# Patient Record
Sex: Female | Born: 1950 | Race: Black or African American | Hispanic: No | Smoking: Never smoker
Health system: Southern US, Community
[De-identification: ages and names within clinical notes are randomized; demographics above are authoritative.]

## PROBLEM LIST (undated history)

## (undated) DIAGNOSIS — F419 Anxiety disorder, unspecified: Secondary | ICD-10-CM

## (undated) DIAGNOSIS — M1990 Unspecified osteoarthritis, unspecified site: Secondary | ICD-10-CM

## (undated) DIAGNOSIS — G4730 Sleep apnea, unspecified: Secondary | ICD-10-CM

## (undated) DIAGNOSIS — I10 Essential (primary) hypertension: Secondary | ICD-10-CM

## (undated) DIAGNOSIS — Z5189 Encounter for other specified aftercare: Secondary | ICD-10-CM

## (undated) DIAGNOSIS — D649 Anemia, unspecified: Secondary | ICD-10-CM

## (undated) DIAGNOSIS — M797 Fibromyalgia: Secondary | ICD-10-CM

## (undated) DIAGNOSIS — F329 Major depressive disorder, single episode, unspecified: Secondary | ICD-10-CM

## (undated) DIAGNOSIS — K219 Gastro-esophageal reflux disease without esophagitis: Secondary | ICD-10-CM

## (undated) DIAGNOSIS — IMO0001 Reserved for inherently not codable concepts without codable children: Secondary | ICD-10-CM

## (undated) DIAGNOSIS — K5792 Diverticulitis of intestine, part unspecified, without perforation or abscess without bleeding: Secondary | ICD-10-CM

## (undated) DIAGNOSIS — F32A Depression, unspecified: Secondary | ICD-10-CM

## (undated) DIAGNOSIS — J189 Pneumonia, unspecified organism: Secondary | ICD-10-CM

## (undated) HISTORY — PX: ABDOMINAL HYSTERECTOMY: SHX81

## (undated) HISTORY — PX: KNEE ARTHROSCOPY: SUR90

## (undated) HISTORY — PX: CHOLECYSTECTOMY: SHX55

## (undated) HISTORY — PX: COLONOSCOPY: SHX174

## (undated) HISTORY — PX: TONSILLECTOMY: SUR1361

## (undated) HISTORY — DX: Sleep apnea, unspecified: G47.30

---

## 1998-06-04 ENCOUNTER — Other Ambulatory Visit: Admission: RE | Admit: 1998-06-04 | Discharge: 1998-06-04 | Payer: Self-pay | Admitting: Obstetrics and Gynecology

## 1999-10-14 ENCOUNTER — Other Ambulatory Visit: Admission: RE | Admit: 1999-10-14 | Discharge: 1999-10-14 | Payer: Self-pay | Admitting: Obstetrics and Gynecology

## 2000-02-15 HISTORY — PX: KNEE ARTHROPLASTY: SHX992

## 2000-12-08 ENCOUNTER — Other Ambulatory Visit: Admission: RE | Admit: 2000-12-08 | Discharge: 2000-12-08 | Payer: Self-pay | Admitting: Obstetrics and Gynecology

## 2001-06-19 ENCOUNTER — Encounter: Payer: Self-pay | Admitting: Orthopedic Surgery

## 2001-06-25 ENCOUNTER — Inpatient Hospital Stay (HOSPITAL_COMMUNITY): Admission: RE | Admit: 2001-06-25 | Discharge: 2001-07-02 | Payer: Self-pay | Admitting: Orthopedic Surgery

## 2001-06-25 ENCOUNTER — Encounter: Payer: Self-pay | Admitting: Orthopedic Surgery

## 2001-06-29 ENCOUNTER — Encounter: Payer: Self-pay | Admitting: Orthopedic Surgery

## 2001-07-02 ENCOUNTER — Inpatient Hospital Stay (HOSPITAL_COMMUNITY)
Admission: AD | Admit: 2001-07-02 | Discharge: 2001-07-07 | Payer: Self-pay | Admitting: Physical Medicine & Rehabilitation

## 2001-12-18 ENCOUNTER — Other Ambulatory Visit: Admission: RE | Admit: 2001-12-18 | Discharge: 2001-12-18 | Payer: Self-pay | Admitting: Obstetrics and Gynecology

## 2001-12-19 ENCOUNTER — Ambulatory Visit (HOSPITAL_BASED_OUTPATIENT_CLINIC_OR_DEPARTMENT_OTHER): Admission: RE | Admit: 2001-12-19 | Discharge: 2001-12-19 | Payer: Self-pay | Admitting: Orthopedic Surgery

## 2004-01-22 ENCOUNTER — Emergency Department (HOSPITAL_COMMUNITY): Admission: EM | Admit: 2004-01-22 | Discharge: 2004-01-22 | Payer: Self-pay | Admitting: Emergency Medicine

## 2005-05-30 ENCOUNTER — Other Ambulatory Visit: Admission: RE | Admit: 2005-05-30 | Discharge: 2005-05-30 | Payer: Self-pay | Admitting: Obstetrics and Gynecology

## 2006-07-07 ENCOUNTER — Other Ambulatory Visit: Admission: RE | Admit: 2006-07-07 | Discharge: 2006-07-07 | Payer: Self-pay | Admitting: Obstetrics and Gynecology

## 2007-11-07 ENCOUNTER — Encounter: Admission: RE | Admit: 2007-11-07 | Discharge: 2007-11-07 | Payer: Self-pay | Admitting: Gastroenterology

## 2009-02-05 ENCOUNTER — Emergency Department (HOSPITAL_COMMUNITY): Admission: EM | Admit: 2009-02-05 | Discharge: 2009-02-05 | Payer: Self-pay | Admitting: Emergency Medicine

## 2010-04-23 ENCOUNTER — Observation Stay (HOSPITAL_COMMUNITY)
Admission: EM | Admit: 2010-04-23 | Discharge: 2010-04-24 | Disposition: A | Discharge status: Home / Self Care | Payer: 59 | Attending: Internal Medicine | Admitting: Internal Medicine

## 2010-04-23 ENCOUNTER — Emergency Department (HOSPITAL_COMMUNITY): Payer: 59

## 2010-04-23 DIAGNOSIS — K859 Acute pancreatitis without necrosis or infection, unspecified: Principal | ICD-10-CM | POA: Insufficient documentation

## 2010-04-23 DIAGNOSIS — Z79899 Other long term (current) drug therapy: Secondary | ICD-10-CM | POA: Insufficient documentation

## 2010-04-23 DIAGNOSIS — R7402 Elevation of levels of lactic acid dehydrogenase (LDH): Secondary | ICD-10-CM | POA: Insufficient documentation

## 2010-04-23 DIAGNOSIS — I10 Essential (primary) hypertension: Secondary | ICD-10-CM | POA: Insufficient documentation

## 2010-04-23 DIAGNOSIS — IMO0001 Reserved for inherently not codable concepts without codable children: Secondary | ICD-10-CM | POA: Insufficient documentation

## 2010-04-23 DIAGNOSIS — E876 Hypokalemia: Secondary | ICD-10-CM | POA: Insufficient documentation

## 2010-04-23 DIAGNOSIS — M1990 Unspecified osteoarthritis, unspecified site: Secondary | ICD-10-CM | POA: Insufficient documentation

## 2010-04-23 DIAGNOSIS — J45909 Unspecified asthma, uncomplicated: Secondary | ICD-10-CM | POA: Insufficient documentation

## 2010-04-23 DIAGNOSIS — R7401 Elevation of levels of liver transaminase levels: Secondary | ICD-10-CM | POA: Insufficient documentation

## 2010-04-23 DIAGNOSIS — D649 Anemia, unspecified: Secondary | ICD-10-CM | POA: Insufficient documentation

## 2010-04-23 DIAGNOSIS — K8020 Calculus of gallbladder without cholecystitis without obstruction: Secondary | ICD-10-CM | POA: Insufficient documentation

## 2010-04-23 DIAGNOSIS — K5730 Diverticulosis of large intestine without perforation or abscess without bleeding: Secondary | ICD-10-CM | POA: Insufficient documentation

## 2010-04-23 LAB — CBC
HCT: 32.5 % — ABNORMAL LOW (ref 36.0–46.0)
Hemoglobin: 10.9 g/dL — ABNORMAL LOW (ref 12.0–15.0)
MCH: 28.7 pg (ref 26.0–34.0)
MCV: 85.5 fL (ref 78.0–100.0)
RBC: 3.8 MIL/uL — ABNORMAL LOW (ref 3.87–5.11)

## 2010-04-23 LAB — URINALYSIS, ROUTINE W REFLEX MICROSCOPIC
Glucose, UA: NEGATIVE mg/dL
Hgb urine dipstick: NEGATIVE
Protein, ur: NEGATIVE mg/dL
Specific Gravity, Urine: 1.02 (ref 1.005–1.030)
pH: 5.5 (ref 5.0–8.0)

## 2010-04-23 LAB — COMPREHENSIVE METABOLIC PANEL
ALT: 76 U/L — ABNORMAL HIGH (ref 0–35)
CO2: 30 mEq/L (ref 19–32)
Calcium: 10.1 mg/dL (ref 8.4–10.5)
Chloride: 96 mEq/L (ref 96–112)
Creatinine, Ser: 0.76 mg/dL (ref 0.4–1.2)
GFR calc non Af Amer: >60 mL/min (ref >60)
Glucose, Bld: 125 mg/dL — ABNORMAL HIGH (ref 70–99)
Total Bilirubin: 1.7 mg/dL — ABNORMAL HIGH (ref 0.3–1.2)

## 2010-04-23 LAB — DIFFERENTIAL
Lymphocytes Relative: 15 % (ref 12–46)
Lymphs Abs: 1.7 10*3/uL (ref 0.7–4.0)
Monocytes Relative: 5 % (ref 3–12)
Neutro Abs: 9.3 10*3/uL — ABNORMAL HIGH (ref 1.7–7.7)
Neutrophils Relative %: 80 % — ABNORMAL HIGH (ref 43–77)

## 2010-04-24 LAB — CBC
MCV: 85.4 fL (ref 78.0–100.0)
Platelets: 296 10*3/uL (ref 150–400)
RBC: 3.35 MIL/uL — ABNORMAL LOW (ref 3.87–5.11)
RDW: 13.8 % (ref 11.5–15.5)
WBC: 6.5 10*3/uL (ref 4.0–10.5)

## 2010-04-24 LAB — COMPREHENSIVE METABOLIC PANEL
ALT: 97 U/L — ABNORMAL HIGH (ref 0–35)
AST: 114 U/L — ABNORMAL HIGH (ref 0–37)
Albumin: 3.4 g/dL — ABNORMAL LOW (ref 3.5–5.2)
Alkaline Phosphatase: 90 U/L (ref 39–117)
CO2: 32 mEq/L (ref 19–32)
Chloride: 100 mEq/L (ref 96–112)
Creatinine, Ser: 0.82 mg/dL (ref 0.4–1.2)
GFR calc non Af Amer: >60 mL/min (ref >60)
Potassium: 3.5 mEq/L (ref 3.5–5.1)
Total Bilirubin: 0.7 mg/dL (ref 0.3–1.2)

## 2010-04-24 LAB — PHOSPHORUS: Phosphorus: 3.2 mg/dL (ref 2.3–4.6)

## 2010-04-24 LAB — LIPID PANEL: Total CHOL/HDL Ratio: 2.3 RATIO

## 2010-04-24 LAB — TSH: TSH: 0.693 u[IU]/mL (ref 0.350–4.500)

## 2010-04-24 LAB — MAGNESIUM: Magnesium: 2.2 mg/dL (ref 1.5–2.5)

## 2010-04-27 ENCOUNTER — Other Ambulatory Visit: Payer: Self-pay | Admitting: Internal Medicine

## 2010-04-27 DIAGNOSIS — K859 Acute pancreatitis without necrosis or infection, unspecified: Secondary | ICD-10-CM

## 2010-05-10 ENCOUNTER — Ambulatory Visit (HOSPITAL_COMMUNITY)
Admission: RE | Admit: 2010-05-10 | Discharge: 2010-05-10 | Disposition: A | Discharge status: Home / Self Care | Payer: 59 | Source: Ambulatory Visit | Attending: General Surgery | Admitting: General Surgery

## 2010-05-10 ENCOUNTER — Other Ambulatory Visit: Payer: Self-pay | Admitting: General Surgery

## 2010-05-10 ENCOUNTER — Other Ambulatory Visit (HOSPITAL_COMMUNITY): Payer: Self-pay | Admitting: General Surgery

## 2010-05-10 ENCOUNTER — Encounter (HOSPITAL_COMMUNITY): Payer: 59

## 2010-05-10 DIAGNOSIS — Z01818 Encounter for other preprocedural examination: Secondary | ICD-10-CM

## 2010-05-10 DIAGNOSIS — K8020 Calculus of gallbladder without cholecystitis without obstruction: Secondary | ICD-10-CM | POA: Insufficient documentation

## 2010-05-10 DIAGNOSIS — Z01810 Encounter for preprocedural cardiovascular examination: Secondary | ICD-10-CM | POA: Insufficient documentation

## 2010-05-10 DIAGNOSIS — Z01812 Encounter for preprocedural laboratory examination: Secondary | ICD-10-CM | POA: Insufficient documentation

## 2010-05-10 LAB — BASIC METABOLIC PANEL
BUN: 6 mg/dL (ref 6–23)
Calcium: 9.5 mg/dL (ref 8.4–10.5)
Creatinine, Ser: 0.66 mg/dL (ref 0.4–1.2)
GFR calc non Af Amer: >60 mL/min (ref >60)
Glucose, Bld: 79 mg/dL (ref 70–99)
Potassium: 3.5 mEq/L (ref 3.5–5.1)

## 2010-05-10 LAB — CBC
HCT: 30.3 % — ABNORMAL LOW (ref 36.0–46.0)
Hemoglobin: 9.7 g/dL — ABNORMAL LOW (ref 12.0–15.0)
MCH: 28 pg (ref 26.0–34.0)
MCHC: 32 g/dL (ref 30.0–36.0)
MCV: 87.3 fL (ref 78.0–100.0)
RDW: 13.7 % (ref 11.5–15.5)

## 2010-05-11 ENCOUNTER — Other Ambulatory Visit: Payer: Self-pay | Admitting: General Surgery

## 2010-05-11 ENCOUNTER — Ambulatory Visit (HOSPITAL_COMMUNITY): Payer: 59

## 2010-05-11 ENCOUNTER — Ambulatory Visit (HOSPITAL_COMMUNITY)
Admission: RE | Admit: 2010-05-11 | Discharge: 2010-05-11 | Disposition: A | Discharge status: Home / Self Care | Payer: 59 | Source: Ambulatory Visit | Attending: General Surgery | Admitting: General Surgery

## 2010-05-11 DIAGNOSIS — Z01812 Encounter for preprocedural laboratory examination: Secondary | ICD-10-CM | POA: Insufficient documentation

## 2010-05-11 DIAGNOSIS — Z79899 Other long term (current) drug therapy: Secondary | ICD-10-CM | POA: Insufficient documentation

## 2010-05-11 DIAGNOSIS — I10 Essential (primary) hypertension: Secondary | ICD-10-CM | POA: Insufficient documentation

## 2010-05-11 DIAGNOSIS — K8010 Calculus of gallbladder with chronic cholecystitis without obstruction: Secondary | ICD-10-CM | POA: Insufficient documentation

## 2010-05-11 DIAGNOSIS — K861 Other chronic pancreatitis: Secondary | ICD-10-CM | POA: Insufficient documentation

## 2010-05-11 LAB — HEPATIC FUNCTION PANEL
ALT: 42 U/L — ABNORMAL HIGH (ref 0–35)
Alkaline Phosphatase: 56 U/L (ref 39–117)
Indirect Bilirubin: 0.4 mg/dL (ref 0.3–0.9)
Total Bilirubin: 0.6 mg/dL (ref 0.3–1.2)
Total Protein: 6.4 g/dL (ref 6.0–8.3)

## 2010-05-17 LAB — URINALYSIS, ROUTINE W REFLEX MICROSCOPIC
Bilirubin Urine: NEGATIVE
Ketones, ur: NEGATIVE mg/dL
Nitrite: NEGATIVE
Protein, ur: NEGATIVE mg/dL
pH: 5.5 (ref 5.0–8.0)

## 2010-05-17 NOTE — Op Note (Signed)
NAMEROANNE, HAYE            ACCOUNT NO.:  [id number]  MEDICAL RECORD NO.:  [id number]           PATIENT TYPE:  O  LOCATION:  DAYL                         FACILITY:  [facility]  PHYSICIAN:  Sharlet Salina T. Maya Arcand, M.D.DATE OF BIRTH:  1950/10/09  DATE OF PROCEDURE:  05/11/2010 DATE OF DISCHARGE:                              OPERATIVE REPORT   PREOPERATIVE DIAGNOSES: 1. Cholelithiasis. 2. Gallstone pancreatitis.  POSTOPERATIVE DIAGNOSES: 1. Cholelithiasis. 2. Gallstone pancreatitis.  SURGICAL PROCEDURE:  Laparoscopic cholecystectomy with intraoperative cholangiogram.  SURGEON:  Sharlet Salina T. Nefertari Rebman, MD  ANESTHESIA:  General.  BRIEF HISTORY:  Ms. Devaux is a 60 year old female who recently presented with an acute episode of severe upper abdominal pain and was found to have evidence of gallstone pancreatitis with a markedly elevated lipase that returned to normal and mildly elevated LFTs that have returned to normal.  She has continued to have some mild upper abdominal symptoms compatible with chronic cholecystitis.  Gallbladder ultrasound has shown multiple small gallstones.  With an apparent episode of gallstone pancreatitis, I have recommended proceeding with laparoscopic cholecystectomy with cholangiogram.  The nature of the procedure, its indications, risks of bleeding, infection, bile leak, bile duct injury, and anesthetic complications were discussed and understood.  The patient was now brought to the operating room for this procedure.  DESCRIPTION OF OPERATION:  The patient was brought to the operating room, placed in supine position on the operating table and general endotracheal anesthesia was induced.  She had received preoperative IV antibiotics.  PAS were placed.  The abdomen was widely sterilely prepped and draped and correct patient and procedure were verified.  Local anesthesia was used to infiltrate the trocar sites.  A 1.5 cm incision was made at the  umbilicus.  Dissection carried down the midline.  Fascia was then incised 1 cm transversely and the peritoneum entered under direct vision.  Through a mattress suture of 0 Vicryl, the Hasson trocar was placed and pneumoperitoneum was established.  Under direct vision, a 11 mm trocar was placed subxiphoid and two 5-mm trocars on the right subcostal margin.  The gallbladder was mildly distended but not acutely inflamed.  There were some omental adhesions and filmy adhesions of the duodenum up to the fundus and infundibulum that were carefully taken down and then the infundibulum retracted inferolaterally.  Peritoneum anterior and posterior to closed triangle was incised and fibrofatty tissue was stripped off the neck of the gallbladder toward the porta hepatis.  The distal gallbladder in closed triangle was thoroughly dissected.  The cystic artery was clearly identified coursing up the gallbladder wall, was divided between 2 proximal and 1 distal clip.  The cystic duct gallbladder junction was dissected.  The cyst duct dissected out over about a centimeter.  The cystic duct gallbladder junction was dissected 360 degrees.  When a good critical view was obtained, the cystic duct was clipped at the gallbladder junction and operative cholangiogram obtained through the cystic duct.  This showed a normal- sized common bile duct and intrahepatic ducts, and there was flow down to the level of the ampulla but the common bile duct did not empty.  I did give  the patient 1 mg of glucagon IV and waited 5 minutes and repeated the cholangiogram with the same findings.  There was not, however, a clear meniscus or stone identified.  The cholangiocath was then removed and the cystic duct was triply clipped proximally and divided.  The gallbladder was dissected free from its bed using hook cautery, placed an EndoCatch bag, and removed through the umbilicus. The right upper quadrant was thoroughly irrigated.   Hemostasis assured. Trocars were removed under direct vision, all CO2 evacuated.  Mattress sutures were secured to the umbilicus.  Skin incisions were closed with subcuticular Monocryl and Dermabond.  I will plan to repeat the patient's LFTs immediately, postoperatively.  If these are elevated, we would consider ERCP or MRCP.  If they are, however normal, we will plan discharge today as recently planned with close followup.     Lorne Skeens. Vian Fluegel, M.D.     Tory Emerald  D:  05/11/2010  T:  05/11/2010  Job:  [id number]  Electronically Signed by Glenna Fellows M.D. on 05/17/2010 01:03:00 PM

## 2010-05-25 ENCOUNTER — Other Ambulatory Visit: Payer: Self-pay | Admitting: Internal Medicine

## 2010-05-31 ENCOUNTER — Ambulatory Visit
Admission: RE | Admit: 2010-05-31 | Discharge: 2010-05-31 | Disposition: A | Discharge status: Home / Self Care | Payer: 59 | Source: Ambulatory Visit | Attending: Internal Medicine | Admitting: Internal Medicine

## 2010-06-01 NOTE — H&P (Signed)
[name], [name]            ACCOUNT NO.:  [id number]  MEDICAL RECORD NO.:  [id number]           PATIENT TYPE:  E  LOCATION:  [facility]                         FACILITY:  [facility]  PHYSICIAN:  Lonia Blood, M.D.      DATE OF BIRTH:  26-Jul-1950  DATE OF ADMISSION:  04/23/2010 DATE OF DISCHARGE:                             HISTORY & PHYSICAL   PRIMARY CARE PHYSICIAN:  Juline Patch, MD  PRESENTING COMPLAINT:  Abdominal pain.  HISTORY OF PRESENT ILLNESS:  The patient is a 60 year old female with known history of fibromyalgia and osteoarthritis who came in complaining of upper abdominal and back pain.  Pain was mainly in the epigastric region, rated as 7/10.  It just hit her today.  Pain has since resolved in the ED.  Associated with some nausea initially but that has also resolved.  It happened right after breakfast.  She denied any diarrhea. Denied any hematemesis or melena.  She denied any bright red blood per rectum.  She took some GI cocktail at home and got some mild relief. She had no prior history of gallstones, no prior history of pancreatitis, and the patient also denied any history of alcohol abuse.  PAST MEDICAL HISTORY:  Includes arthritis, asthma, diverticulitis, fibromyalgia, osteoarthritis, hypertension.  ALLERGIES:  SHELLFISH.  CURRENT MEDICATIONS:  Include Tylenol, estradiol, hydrochlorothiazide, Mobic, tramadol, acetaminophen, as well as Xanax.  SOCIAL HISTORY:  The patient lives in [place] with her husband. Denied any tobacco, alcohol, or IV drug use.  FAMILY HISTORY:  Denied any significant family history for cancer, etc.  REVIEW OF SYSTEMS:  All systems were reviewed and currently negative except per HPI.  PHYSICAL EXAMINATION:  VITAL SIGNS:  Temperature 98.0, blood pressure 156/61 with a pulse of 88, respiratory rate 20, sats 99% on room air. GENERAL:  She is awake, alert, oriented, obese woman.  She is in no acute distress. HEENT:  PERRLA.  EOMI.  No  pallor, no jaundice, no rhinorrhea. NECK:  Supple.  No JVD, no lymphadenopathy. RESPIRATORY:  She has good air entry bilaterally.  No wheezes, no rales, no crackles. CARDIOVASCULAR SYSTEM:  She has S1 and S2.  No audible murmur. ABDOMEN:  Soft full, nontender with positive bowel sounds. EXTREMITIES:  Showed no edema, cyanosis, or clubbing. SKIN:  No rashes.  No ulcers.  LABS:  Her lipase was 1305.  Sodium is 137, potassium 3.0, chloride 96, CO2 30, glucose 125, BUN 11, creatinine 0.76.  Total bilirubin 1.7, alkaline phosphatase 89, AST 773, ALT 76, total protein is 7.7, albumin 4.1, and calcium 10.1.  White count is 11.7, hemoglobin 10.9 with platelet count of 329.  Left shift ANC of 9.3.  Urinalysis showed negative findings.  Abdominal ultrasound showed cholelithiasis without evidence of acute cholecystitis.  There was right lower abdominal pelvic kidney.  ASSESSMENT:  This is a 60 year old female with what appears to be possibly gallstone pancreatitis.  From the look since the patient may have passed a gallstone which is why her symptoms have resolved for the most part.  Due to her high lipase and also some elevated LFTs, we will admit the patient mainly for observation.  PLAN: 1. Gallstone pancreatitis.  The patient's symptoms are resolving.  We     will admit her overnight for observation.  Start her on clear     liquids and see if she tolerates it, then advance to regular diet.     Once she is able to take regular diet, we will discharge her home     to have a followup with Surgery.  This appears the patient will     ultimately need to have cholecystectomy done. 2. Cholelithiasis.  As indicated above, she will need to have     cholecystectomy, probably as an elective procedure. 3. History of asthma.  This is stable.  She is not having any symptoms     at this point. 4. Hypertension.  Blood pressure seems okay at this point.  I will     hold hydrochlorothiazide but restart it  once she is fully back on     her diet. 5. Hypokalemia.  I will replete her potassium as soon as possible.     Other than that further treatment will depend on how the patient     responds to these measures.     Lonia Blood, M.D.     Verlin Grills  D:  04/24/2010  T:  04/24/2010  Job:  [id number]  Electronically Signed by Lonia Blood M.D. on 06/01/2010 03:29:10 PM

## 2010-06-16 NOTE — Discharge Summary (Signed)
NAMEKRISHANA, LUTZE            ACCOUNT NO.:  [id number]  MEDICAL RECORD NO.:  [id number]           PATIENT TYPE:  I  LOCATION:  3705                         FACILITY:  [facility]  PHYSICIAN:  Ladell Pier, M.D.   DATE OF BIRTH:  25-Sep-1950  DATE OF ADMISSION:  04/23/2010 DATE OF DISCHARGE:  04/24/2010                              DISCHARGE SUMMARY   DISCHARGE DIAGNOSES: 1. Acute pancreatitis secondary to possibly gallstones that has     probably passed and pain is now resolved. 2. Mild transaminitis that is improved. 3. Arthritis. 4. Asthma. 5. History of diverticulitis. 6. Fibromyalgia. 7. Osteoarthritis. 8. Hypertension.  DISCHARGE MEDICATIONS: 1. Alprazolam 0.5 mg every 8 hours as needed for anxiety. 2. Caltrate daily. 3. Estrace 2 mg daily. 4. Flonase nasal spray daily as needed. 5. Hydrochlorothiazide 25 mg daily. 6. Meloxicam 15 mg daily. 7. Multivitamins daily. 8. Osteo Bi-Flex 2-3 times a day. 9. Tramadol 50 mg 1-2 every 6 hours as needed. 10.Tylenol Arthritis 3 times a day. 11.Effexor XR 75 mg daily. 12.Zyrtec 10 mg twice daily as needed.  FOLLOWUP APPOINTMENTS:  The patient to follow up with Dr. Ricki Miller on Monday morning.  PROCEDURES:  Ultrasound of the abdomen.  There is no evidence of intrahepatic or extrahepatic biliary dilatation.  The common bile duct measures 5.2 mm at its greatest diameter.  No focal abnormality in the liver.  No cholecystitis seen.  Spleen is its normal size.  There is no focal pancreatic abnormality.  CONSULTANTS:  None.  HISTORY OF PRESENT ILLNESS:  The patient is a 60 year old female with known history of fibromyalgia, osteoarthritis, came in complaining of abdominal pain.  Pain is now resolved in the emergency room.  The patient was admitted for further management secondary to elevated lipase and tract transaminitis.  Past medical history, family history, social history, meds, allergies, review of systems per admission H and  P.  PHYSICAL EXAMINATION:  VITAL SIGNS:  At the time of discharge, temperature 97.5, pulse 80, respirations 18, blood pressure 148/76, pulse ox 100% on room air. GENERAL:  The patient is sitting up in bed, well-nourished African American female. HEENT:  Normocephalic, atraumatic.  Pupils reactive to light.  Throat without erythema. CARDIOVASCULAR:  Regular rate and rhythm. LUNGS:  Clear bilaterally. ABDOMEN:  Soft, nontender, nondistended.  Positive bowel sounds.  No guarding.  No rebound. EXTREMITIES:  Without edema.  HOSPITAL COURSE: 1. Abdominal pain:  The patient was admitted to the hospital for     further management.  She did have some transaminitis that is     improving.  She had increased lipase.  Her pain is totally gone.     She does not have acute cholecystitis nor does she have any duct     obstruction.  She does have sludge in the gallbladder and some     gallstones, so most likely the patient will at some point in time     need a cholecystectomy or she could probably get a HIDA with the EF     procedure done as well, but we will defer to Dr. Ricki Miller.  The patient     will  follow up with Dr. Ricki Miller on Monday.  She is on     hydrochlorothiazide but she has been on that for years she said, so     we will defer any further management of her     pancreatitis/transaminitis to Dr. Ricki Miller.  Her symptoms have totally     resolved. 2. Hypertension.  We will continue her on the hydrochlorothiazide. 3. Arthritis.  Continue her on her home medications.  LABORATORY DATA:  At the time of discharge, TSH 0.693, lipase of 116, sodium of 139, potassium 3.5, chloride 100, CO2 32, glucose 120, BUN 7, creatinine 0.82.  Alk phos 90, AST 114, ALT 97.  Total cholesterol 136, LDL of 59, HDL of 58.  Initial lipase on presentation was 1305 and now is 116.  UA negative.  Time spent with the patient discussing plan of care and discharge plan is approximately 35 minutes.     Ladell Pier,  M.D.     N[place]/MEDQ  D:  04/24/2010  T:  04/25/2010  Job:  [id number]  cc:   Juline Patch, M.D.  Electronically Signed by Virginia Rochester M.D. on 06/16/2010 03:54:34 PM

## 2010-07-02 NOTE — Op Note (Signed)
[place]  Patient:    [name], [name] Visit Number: [id number] MRN: [id number]          Service Type: SUR Location: [id number] Attending Physician:  Colbert Ewing Dictated by:   Loreta Ave, M.D. Proc. Date: 06/25/01 Admit Date:  06/25/2001                             Operative Report  PREOPERATIVE DIAGNOSIS:  End-stage degenerative arthritis, right knee, with valgus alignment and flexion contracture.  POSTOPERATIVE diagnosis:  End-stage degenerative arthritis, right knee, with valgus alignment and flexion contracture.  Marked osteopenia, medial tibial plateau.  Intraoperative unicortical crack, medial tibial plateau, during seating of tibial component.  PROCEDURE:  Right total knee replacement, Osteonics prosthesis. Press-fit #7, posterior stabilizing femoral component.  Cemented #7 tibial component with 12 mm polyethylene insert.  Cemented recess non-metal back 30 mm patellar component.  Appropriate soft tissue balancing, including lateral retinacular release.  4.5 mm lag screw fixation of unicortical crack, medial tibial plateau, with screw and washer.  SURGEON:  Loreta Ave, M.D.  ASSISTANT:  Arlys John D. Petrarca, P.A.-C.  ANESTHESIA:  General.  BLOOD LOSS:  Minimal.  SPECIMENS:  Excised bone and soft tissue.  CULTURES: None.  COMPLICATIONS:  None.  DRESSINGS:  Soft compressive drain, Hemovac x2.  PROCEDURE:  The patient was brought to the operating room and placed on the operating table in the supine position.  After adequate anesthesia had been obtained, the right knee was examined.  7-degree or more flexion contracture. Further flexion to 90 degrees.  Alignment and valgus more than 10 degrees, barely correctable.  Tourniquet applied.  Prepped and draped in the usual sterile fashion.  Examined with elevation of the Esmarch.  Tourniquet inflated to 350 mmHg.  Straight incision above the patella down  to the tibial tubercle, staying proximal to two small scratches she had on her leg distal to this. Skin and subcutaneous tissue were divided.  Medial parapatellar arthrotomy. Soft tissue release, both medial and lateral.  Periarticular spurs and remnants of cruciate ligaments, menisci, and loose bodies all removed. Distal femur exposed.  Intramedullary guide placed.  Additional cuts at 5 degrees of valgus.  Noted to be very osteopenic on the medial femoral, very sclerotic laterally were all over weight-bearing areas.  After being excised with a #7 component and after a 10 mm distal cut, jigs were put in place, and definitive cuts were made for a posterior subluxing #7 component.  Trial put in place and fit well.  Trial removed.  Tibia exposed.  Tibial spines removed with a saw. Very osteopenic on the medial tibia.  Intramedullary guide placed.  Proximal cut, 5-degree posterior slope of only 6 mm.  Tibia excised with a #7 component.  Patella was incised, reamed, and drilled for a 30 mm component. All trials were put in place, #7 on the femur and #7 on the tibia and 30 mm on the patella.  With the 12 mm insert, I had full extension and full flexion nicely with balanced knee at 5 degrees of valgus.  Lateral release performed with cautery and as necessary to balance the patellofemoral joint.  Following this, she had good tracking. The tibia was marked for appropriate rotation, and then punches were used to open it up for the tibial component.  Again noted to be very osteopenic medially.  The wound was then copiously irrigated with the pulse  irrigating device.  Cement prepared and placed on the tibial component, which was hammered in place in the usual manner.  With this, she developed a 5 cm long crack on the anterior aspect of the plateau.  This gapped very slightly.  It did not significantly compromise any fixation of the tibia, but I was concerned that this could progress because of the  degree of osteopenia, which was, in fact, the cause of the crack developing.  Once the tibial component was well-seated, we placed a lag screw, 4.5 mm, across from the medial cortex of the tibia over to the lateral side.  We had to put a washer on the screw because of her osteopenia medially and tacked with a screw.  We just were countersinking into the bone without the washer.  Once this was in place, that small unicortical crack which we had packed with some cancellous bone before we placed the screw was compressed and solidly fixed. Again, this was just in the anterior cortex and did not extend more posteriorly.  Polyethylene was attached to the tibial component after excessive cement had been removed.  The femoral component was seated. The knee reduced.  The patellar component had cement applied and was compressed down into place and excessive cement removed.  Once the cement hardened, the knee was re-examined.  Full extension and full flexion.  No component lift-off. Good stability.  Good patellofemoral tracking after lateral released.  The fluoroscopic unit was used to confirm good placement of the screw that was placed anterior to the tibial component from medial to lateral.  You could not see the unicortical crack at all on any of the films.  The wound was irrigated. A Hemovac was placed and brought out through separate stab wounds. Arthrotomy was closed with #1 Vicryl and the skin and subcutaneous tissue with Vicryl and staples.  Margins were injected with Marcaine, as was the knee.  A sterile compressive dressing was applied after Hemovacs were clamped. Tourniquet deflated and removed.  Knee immobilizer applied.  The incision was was reversed.  Brought to recovery room.  Tolerated the surgery well with no complications. Dictated by:   Loreta Ave, M.D. Attending Physician:  Colbert Ewing DD:  06/25/01 TD:  06/26/01 Job: [id number]

## 2010-07-02 NOTE — Op Note (Signed)
   NAME:  [name], [name]                      ACCOUNT NO.:  [id number]   MEDICAL RECORD NO.:  [id number]                   PATIENT TYPE:  AMB   LOCATION:  DSC                                  FACILITY:  [facility]   PHYSICIAN:  Loreta Ave, M.D.              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   OPERATION PERFORMED:  Right knee exam and manipulation under anesthesia with  intra-articular injection with Depo-Medrol, Marcaine and morphine.   SURGEON:  Loreta Ave, M.D.   ASSISTANT:  Arlys John D. Petrarca, P.A.-C.   ANESTHESIA:  General.   DESCRIPTION OF PROCEDURE:  The patient was brought to the operating room and  after adequate anesthesia was obtained, the right knee was examined.  Good  alignment and good stability.  Just about full extension.  Flexion to  abruptly stopping at 90 degrees.  Decreased patellofemoral mobility.  The  knee was gently manipulated to break up adhesions achieving full extension  and flexion to 130 degrees without difficulty.  Improved patellofemoral  mobility and maintained good stability.  Under sterile technique injected  intra-articularly with Depo-Medrol, Marcaine and morphine.  Anesthesia  reversed.  Brought to recovery room.  Tolerated surgery well.  No  complications.                                               Loreta Ave, M.D.    DFM/MEDQ  D:  12/19/2001  T:  12/19/2001  Job:  [id number]

## 2010-07-02 NOTE — Procedures (Signed)
[place]  Patient:    [name], [name] Visit Number: [id number] MRN: [id number]          Service Type: SUR Location: [id number] Attending Physician:  Colbert Ewing Dictated by:   Shela Commons. Claybon Jabs, M.D. Proc. Date: 06/25/01 Admit Date:  06/25/2001                             Procedure Report  PROCEDURE: Epidural catheter placement.  ANESTHESIOLOGIST: Halford Decamp, M.D.  HISTORY: [name] is a 60 year old black female, who presents to the operating room for total knee arthroplasty.  I discussed with her her postoperative control options.  After understanding the risks and benefits of an epidural catheter she requested this for her postoperative pain management.  PROCEDURE: Following her surgery the patient remained under general anesthesia and was placed in the left lateral decubitus position.  The lumbar spine was sterilely cleaned and draped.  A 17 gauge Tuohy needle was advanced via loss of resistance technique at the L2-L3 interspace.  The epidural needle did not aspirate blood or CSF, and the epidural catheter was then inserted 4 cm through the needle and the needle was removed.  The catheter did not aspirate blood or CSF and a 5 cc test dose of 1% lidocaine was negative.  The catheter was then taped to the patients back and the patient was given 3 cc of Fentanyl with 5 cc of 0.25% Marcaine.  The patient tolerated the procedure well, was extubated, brought to the PACU, placed on a Marcaine and Fentanyl infusion, and followed by the acute pain service.  The patient will start her Coumadin tomorrow, Jun 26, 2001, and the catheter will be discontinued on Jun 27, 2001. Dictated by:   Shela Commons. Claybon Jabs, M.D. Attending Physician:  Colbert Ewing DD:  06/25/01 TD:  06/26/01 Job: [id number]

## 2010-07-02 NOTE — Discharge Summary (Signed)
NAMEJAYLEIGH, [name]                      ACCOUNT NO.:  [id number]   MEDICAL RECORD NO.:  [id number]                   PATIENT TYPE:  NP   LOCATION:  5015                                 FACILITY:  [name]   PHYSICIAN:  [name], M.D.             DATE OF BIRTH:  09-Sep-1950   DATE OF ADMISSION:  06/25/2001  DATE OF DISCHARGE:  07/02/2001                                 DISCHARGE SUMMARY   DISCHARGE DIAGNOSES:  1. Status post right total knee arthroplasty secondary to osteoarthritis.  2. Anemia.  3. History of urinary tract infection.  4. Anxiety.   HISTORY OF PRESENT ILLNESS:  Sherry patient is a 60 year old female with past  medical history of asthma and anemia admitted on Jun 25, 2001 for elective  right total knee arthroplasty by [name].  Sherry patient was on  Coumadin for DVT prophylaxis.  Knox course significant for anemia, UTI,  and hypokalemia.  PT report at this time indicates Sherry patient is ambulating  56 feet with a knee immobilizer and rolling walker with minimal assist.  Sherry  patient can transfer sit-to-stand with minimal assist.  She is touchdown  weightbearing.  Sherry patient is presently on Tequin for a UTI and she was  transferred to [name] on Jul 02, 2001.   PAST MEDICAL HISTORY:  Anemia, asthma, and OA.   PAST SURGICAL HISTORY:  Hysterectomy, arthroscopy of Sherry knees.   ALLERGIES:  SHELLFISH.   PRIMARY CARE [name], M.D.   FAMILY HISTORY:  Noncontributory.   SOCIAL HISTORY:  Sherry patient lives in a one-level home alone in [name]  with two steps to entry.  Independent prior to admission.  Denies any  tobacco, occasional alcohol, and has no family to assist.  Employed at  [name] for 29 years.   REVIEW OF SYSTEMS:  Significant for reflux, shortness of breath.  Denies any  chest pain.  No nausea or vomiting or GI problems.   [facility] COURSE:  Sherry patient was admitted to [name] on  Jul 02, 2001 for comprehensive inpatient rehabilitation where she received  more than three hours of PT/OT daily.  Overall, Sherry patient made great  progress during her five-day stay.  She progressed to modified independent  at time of discharge. She remained on Coumadin and Lovenox until INR was  therapeutic for DVT prophylaxis.  She had venous Dopplers performed on Jul 05, 2001 which demonstrated no evidence of DVT, superficial thrombus, or  Baker's cyst.  At Sherry time of admission, she was placed on Tequin 400 mg  p.o. for approximately three days and she completed a seven-day course for a  urinary tract infection.  Pain was controlled with OxyContin and Duragesic  patches.  She remained on Trinsicon p.o. b.i.d. for anemia.  She had no  significant medical issues that occurred while she was in rehab.  Latest  labs indicated that her hemoglobin was 9.2, hematocrit 26.5, white blood  cell count 8.6, platelet count 408.  Her sodium was 139, potassium 4.4,  chloride 101, CO2 28, glucose 111, BUN 11, creatinine 0.9.  A urine culture  was performed on Jul 02, 2001 which demonstrated 2000 colonies with urine  pathogens isolated.  Her latest INR was 2.1 performed on Jul 06, 2001.  At  time of discharge, all vitals were stable.  Surgery incision did reveal 1+  edema.  She still only had 30 degrees flexion in her right knee.  Sherry rest  of Sherry exam was unremarkable.  PT report indicated that Sherry patient was able  to ambulate modified independent with a standard walker 100 feet, transfer  sit-to-stand modified independently, bed mobility modified independent.  She  performed most ADL's supervision to modified independently.  She is still  touchdown weightbearing.  By time of discharge, she had approximately 75  degrees flexion in her by CPM machine in her right knee.  Sherry patient is  discharged home with her family.   DISCHARGE MEDICATIONS:  1. Resume Duragesic patch every  three days.  2. Xanax 0.5 mg twice daily.  3. Zyrtec 10 mg as needed.  4. Trinsicon 1 tablet twice daily.  5. Coumadin 1 tablet daily until July 26, 2001.  6. Oxycodone 5-10 mg every 4-6 hours as needed for pain.   DISCHARGE INSTRUCTIONS:  No aspirin, no ibuprofen, no Aleve while on  Coumadin.  Okay to take Tylenol.  She is to use her home CPM.  No driving,  use a walker, no drinking.  She is to maintain touchdown weightbearing.  She  will have [name] for PT/OT and monitor INR.  Sherry first draw  will be Tuesday, Jul 03, 2001.  She will follow up with [name]  within two weeks, follow up with [name] as needed, follow up  with [name] within 4-6 weeks, her primary care [name].     [name]                             [name], M.D.    LH/MEDQ  D:  09/19/2001  T:  09/24/2001  Job:  [id number]   cc:   [name], M.D.   [name], M.D.

## 2010-11-12 ENCOUNTER — Other Ambulatory Visit: Payer: Self-pay | Admitting: Obstetrics and Gynecology

## 2010-11-12 DIAGNOSIS — R928 Other abnormal and inconclusive findings on diagnostic imaging of breast: Secondary | ICD-10-CM

## 2010-11-17 ENCOUNTER — Ambulatory Visit
Admission: RE | Admit: 2010-11-17 | Discharge: 2010-11-17 | Disposition: A | Discharge status: Home / Self Care | Payer: 59 | Source: Ambulatory Visit | Attending: Obstetrics and Gynecology | Admitting: Obstetrics and Gynecology

## 2010-11-17 DIAGNOSIS — R928 Other abnormal and inconclusive findings on diagnostic imaging of breast: Secondary | ICD-10-CM

## 2011-05-06 ENCOUNTER — Encounter (HOSPITAL_COMMUNITY): Payer: Self-pay | Admitting: Pharmacy Technician

## 2011-05-09 NOTE — Pre-Procedure Instructions (Addendum)
20 [name]  05/09/2011   Your procedure is scheduled on:  Wed, April 3   Report to [facility] at 0750 AM.  Call this number if you have problems the morning of surgery: [phone]   Remember:   Do not eat food:After Midnight.  May have clear liquids: up to 4 Hours before arrival.(until 3:50 am)  Clear liquids include soda, tea, black coffee, apple or grape juice, broth,water  Take these medicines the morning of surgery with A SIP OF WATER: Clonazepam,Claritin,Effexor,and tylenol,  Tramadol(if needed) STOP  Meloxicam,osteobiflex, any aspirin , anti inflammatory, herbal meds ,blood thinners 3.27.13   Do not wear jewelry, make-up or nail polish.  Do not wear lotions, powders, or perfumes.   Do not shave 48 hours prior to surgery.  Do not bring valuables to the hospital.  Contacts, dentures or bridgework may not be worn into surgery.  Leave suitcase in the car. After surgery it may be brought to your room.  For patients admitted to the hospital, checkout time is 11:00 AM the day of discharge.   Special Instructions: Incentive Spirometry - Practice and bring it with you on the day of surgery. and CHG Shower Use Special Wash: 1/2 bottle night before surgery and 1/2 bottle morning of surgery.       [name] [phone]   [phone]  Please read over the following fact sheets that you were given: Pain Booklet, Coughing and Deep Breathing, Blood Transfusion Information, Total Joint Packet, MRSA Information and Surgical Site Infection Prevention

## 2011-05-10 ENCOUNTER — Encounter (HOSPITAL_COMMUNITY): Payer: Self-pay

## 2011-05-10 ENCOUNTER — Other Ambulatory Visit: Payer: Self-pay

## 2011-05-10 ENCOUNTER — Encounter (HOSPITAL_COMMUNITY)
Admission: RE | Admit: 2011-05-10 | Discharge: 2011-05-10 | Disposition: A | Discharge status: Home / Self Care | Payer: 59 | Source: Ambulatory Visit | Attending: Surgery | Admitting: Surgery

## 2011-05-10 ENCOUNTER — Other Ambulatory Visit (HOSPITAL_COMMUNITY): Payer: 59

## 2011-05-10 ENCOUNTER — Encounter (HOSPITAL_COMMUNITY)
Admission: RE | Admit: 2011-05-10 | Discharge: 2011-05-10 | Disposition: A | Discharge status: Home / Self Care | Payer: 59 | Source: Ambulatory Visit | Attending: Orthopedic Surgery | Admitting: Orthopedic Surgery

## 2011-05-10 HISTORY — DX: Anemia, unspecified: D64.9

## 2011-05-10 HISTORY — DX: Fibromyalgia: M79.7

## 2011-05-10 HISTORY — DX: Reserved for inherently not codable concepts without codable children: IMO0001

## 2011-05-10 HISTORY — DX: Encounter for other specified aftercare: Z51.89

## 2011-05-10 HISTORY — DX: Unspecified osteoarthritis, unspecified site: M19.90

## 2011-05-10 HISTORY — DX: Major depressive disorder, single episode, unspecified: F32.9

## 2011-05-10 HISTORY — DX: Depression, unspecified: F32.A

## 2011-05-10 HISTORY — DX: Pneumonia, unspecified organism: J18.9

## 2011-05-10 LAB — SURGICAL PCR SCREEN: Staphylococcus aureus: NEGATIVE

## 2011-05-10 LAB — COMPREHENSIVE METABOLIC PANEL
AST: 22 U/L (ref 0–37)
Albumin: 3.9 g/dL (ref 3.5–5.2)
Calcium: 10.2 mg/dL (ref 8.4–10.5)
Creatinine, Ser: 0.83 mg/dL (ref 0.50–1.10)

## 2011-05-10 LAB — TYPE AND SCREEN
ABO/RH(D): B POS
Antibody Screen: NEGATIVE

## 2011-05-10 LAB — CBC
MCH: 28.8 pg (ref 26.0–34.0)
MCV: 86 fL (ref 78.0–100.0)
Platelets: 259 10*3/uL (ref 150–400)
RDW: 13.6 % (ref 11.5–15.5)
WBC: 6.3 10*3/uL (ref 4.0–10.5)

## 2011-05-10 LAB — URINALYSIS, ROUTINE W REFLEX MICROSCOPIC
Bilirubin Urine: NEGATIVE
Hgb urine dipstick: NEGATIVE
Ketones, ur: NEGATIVE mg/dL
Specific Gravity, Urine: 1.027 (ref 1.005–1.030)
Urobilinogen, UA: 0.2 mg/dL (ref 0.0–1.0)

## 2011-05-10 LAB — PROTIME-INR: INR: 1 (ref 0.00–1.49)

## 2011-05-10 LAB — ABO/RH: ABO/RH(D): B POS

## 2011-05-11 NOTE — Consult Note (Signed)
Anesthesia:  Patient is a 61 year old female scheduled for a left TKA on 05/18/11.  History includes lap chole 05/11/10, asthma, non-smoker, arthritis, depression, anemia with history of transfusion, fibromyalgia, history of PNA, prior hysterectomy and knee surgeries.    Labs noted.  H/H 9.9 and 29.6.  (Her Hgb has been 9.5-10.9 since 04/24/10, so overall appears stable.) T&S already done.   CXR on 05/10/11 showed no acute cardiopulmonary process.  EKG from 05/10/11 showed NSR.  Plan to proceed.

## 2011-05-17 MED ORDER — CEFAZOLIN SODIUM-DEXTROSE 2-3 GM-% IV SOLR
2.0000 g | INTRAVENOUS | Status: AC
  Administered 2011-05-18: 2 g via INTRAVENOUS
  Filled 2011-05-17: qty 50

## 2011-05-17 NOTE — H&P (Signed)
  [facility] [address]   [address] [place], [place] [address] [phone] A Division of [facility]  Loreta Ave, M.D.     Robert A. Thurston Hole, M.D.     Lunette Stands, M.D. Eulas Post, M.D.    Buford Dresser, M.D. Estell Harpin, M.D. Ralene Cork, D.O.          Genene Churn. Barry Dienes, PA-C            Kirstin A. Shepperson, PA-C Diablock, OPA-C   RE: [name], [name]                                [id number]      DOB: 12-Feb-1951 PROGRESS NOTE: 05-10-11 Chief complaint: Left knee pain.  History of present illness: 61 year-old black female with a history of end stage DJD, left knee, and chronic pain.  Returns.  States that knee symptoms unchanged from previous visit.  She is wanting to proceed with total knee replacement as scheduled.  She did not go for her pre-op clearance with Dr. Ricki Miller yet.   Current medications: Xanax, Caltrate, Estradiol, Flonase, Mobic, multivitamins, Osteo Bi-Flex, Tramadol, Tylenol, Venlafaxine and Zyrtec. Allergies: Shellfish.  Patient states that she has not had a reaction to Betadine in the past.   Review of systems: Patient denies lightheadedness, dizziness, fevers, chills, cardiac, pulmonary, GI or GU issues.   Family history: Positive for hypertension and arthritis.   Social history: Does not smoke or drink.  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               EXAMINATION: Height: 5?3.  Weight: 215 pounds.  Temperature: 98.9.  Blood pressure: 131/88.  Head is normocephalic, a traumatic.  PERRLA, EOMI.  Neck unremarkable.  Lungs: CTA bilaterally.  No wheezes noted.  Heart: RRR.  No murmurs noted.  Abdomen: Round and non-distended.  NBS x 4.  Soft and non-tender.  Left knee: Decreased range of motion.  Positive crepitus.  Ligaments stable.  1-2+ effusion.  Calf non-tender.  Neurovascularly intact.  Skin warm and dry.   IMPRESSION: End stage DJD, left  knee, and chronic pain.  Failed conservative treatment.    PLAN:  We will proceed with left total knee replacement as scheduled.  Advised patient that she needs to keep the appointment scheduled with Dr. Ricki Miller for clearance.  She stated that she didn't think that she needed to go to that appointment with him since she was going for pre-op labs, but I explained to her that this was not the case.  Surgical procedure, along with potential rehab/recovery time discussed.  All questions answered.    Loreta Ave, M.D.   Electronically verified by Loreta Ave, M.D. DFM(JMO):jjh D 05-10-11 T 05-11-11

## 2011-05-18 ENCOUNTER — Encounter (HOSPITAL_COMMUNITY): Payer: Self-pay | Admitting: Vascular Surgery

## 2011-05-18 ENCOUNTER — Inpatient Hospital Stay (HOSPITAL_COMMUNITY)
Admission: RE | Admit: 2011-05-18 | Discharge: 2011-05-23 | DRG: 470 | Disposition: A | Discharge status: Skilled Nursing Facility | Payer: 59 | Source: Ambulatory Visit | Attending: Orthopedic Surgery | Admitting: Orthopedic Surgery

## 2011-05-18 ENCOUNTER — Encounter (HOSPITAL_COMMUNITY): Payer: Self-pay | Admitting: Anesthesiology

## 2011-05-18 ENCOUNTER — Encounter (HOSPITAL_COMMUNITY): Payer: Self-pay | Admitting: *Deleted

## 2011-05-18 ENCOUNTER — Other Ambulatory Visit: Payer: Self-pay

## 2011-05-18 ENCOUNTER — Encounter (HOSPITAL_COMMUNITY)
Admission: RE | Disposition: A | Discharge status: Skilled Nursing Facility | Payer: Self-pay | Source: Ambulatory Visit | Attending: Orthopedic Surgery

## 2011-05-18 ENCOUNTER — Ambulatory Visit (HOSPITAL_COMMUNITY): Payer: 59 | Admitting: Vascular Surgery

## 2011-05-18 DIAGNOSIS — J45909 Unspecified asthma, uncomplicated: Secondary | ICD-10-CM | POA: Diagnosis present

## 2011-05-18 DIAGNOSIS — D6489 Other specified anemias: Secondary | ICD-10-CM | POA: Diagnosis present

## 2011-05-18 DIAGNOSIS — Z91013 Allergy to seafood: Secondary | ICD-10-CM

## 2011-05-18 DIAGNOSIS — M1710 Unilateral primary osteoarthritis, unspecified knee: Principal | ICD-10-CM | POA: Diagnosis present

## 2011-05-18 DIAGNOSIS — F329 Major depressive disorder, single episode, unspecified: Secondary | ICD-10-CM | POA: Diagnosis present

## 2011-05-18 DIAGNOSIS — F3289 Other specified depressive episodes: Secondary | ICD-10-CM | POA: Diagnosis present

## 2011-05-18 DIAGNOSIS — IMO0001 Reserved for inherently not codable concepts without codable children: Secondary | ICD-10-CM | POA: Diagnosis present

## 2011-05-18 DIAGNOSIS — K5730 Diverticulosis of large intestine without perforation or abscess without bleeding: Secondary | ICD-10-CM | POA: Diagnosis present

## 2011-05-18 DIAGNOSIS — Z01812 Encounter for preprocedural laboratory examination: Secondary | ICD-10-CM

## 2011-05-18 DIAGNOSIS — Z471 Aftercare following joint replacement surgery: Secondary | ICD-10-CM

## 2011-05-18 DIAGNOSIS — Z8249 Family history of ischemic heart disease and other diseases of the circulatory system: Secondary | ICD-10-CM

## 2011-05-18 DIAGNOSIS — Z7901 Long term (current) use of anticoagulants: Secondary | ICD-10-CM

## 2011-05-18 DIAGNOSIS — Z79899 Other long term (current) drug therapy: Secondary | ICD-10-CM

## 2011-05-18 HISTORY — PX: TOTAL KNEE ARTHROPLASTY: SHX125

## 2011-05-18 SURGERY — ARTHROPLASTY, KNEE, TOTAL
Anesthesia: General | Site: Knee | Laterality: Left | Wound class: Clean

## 2011-05-18 MED ORDER — NALOXONE HCL 0.4 MG/ML IJ SOLN
0.4000 mg | INTRAMUSCULAR | Status: DC | PRN
  Filled 2011-05-18: qty 1

## 2011-05-18 MED ORDER — WARFARIN - PHARMACIST DOSING INPATIENT: Freq: Every day | Status: DC

## 2011-05-18 MED ORDER — LOSARTAN POTASSIUM 50 MG PO TABS
100.0000 mg | ORAL_TABLET | Freq: Every day | ORAL | Status: DC
  Administered 2011-05-18 – 2011-05-23 (×6): 100 mg via ORAL
  Filled 2011-05-18 (×6): qty 2

## 2011-05-18 MED ORDER — WARFARIN VIDEO: Freq: Once | Status: DC

## 2011-05-18 MED ORDER — SODIUM CHLORIDE 0.9 % IJ SOLN: 9.0000 mL | INTRAMUSCULAR | Status: DC | PRN

## 2011-05-18 MED ORDER — ONDANSETRON HCL 4 MG/2ML IJ SOLN: 4.0000 mg | Freq: Four times a day (QID) | INTRAMUSCULAR | Status: DC | PRN

## 2011-05-18 MED ORDER — LORATADINE 10 MG PO TABS
10.0000 mg | ORAL_TABLET | Freq: Every day | ORAL | Status: DC
  Administered 2011-05-18 – 2011-05-23 (×6): 10 mg via ORAL
  Filled 2011-05-18 (×6): qty 1

## 2011-05-18 MED ORDER — GLYCOPYRROLATE 0.2 MG/ML IJ SOLN
INTRAMUSCULAR | Status: DC | PRN
  Administered 2011-05-18: .2 mg via INTRAVENOUS

## 2011-05-18 MED ORDER — PHENOL 1.4 % MT LIQD: 1.0000 | OROMUCOSAL | Status: DC | PRN

## 2011-05-18 MED ORDER — ENOXAPARIN SODIUM 30 MG/0.3ML ~~LOC~~ SOLN
30.0000 mg | Freq: Two times a day (BID) | SUBCUTANEOUS | Status: DC
  Administered 2011-05-19 – 2011-05-23 (×9): 30 mg via SUBCUTANEOUS
  Filled 2011-05-18 (×12): qty 0.3

## 2011-05-18 MED ORDER — FENTANYL CITRATE 0.05 MG/ML IJ SOLN: 50.0000 ug | INTRAMUSCULAR | Status: DC | PRN

## 2011-05-18 MED ORDER — MORPHINE SULFATE (PF) 1 MG/ML IV SOLN: INTRAVENOUS | Status: DC

## 2011-05-18 MED ORDER — ONDANSETRON HCL 4 MG/2ML IJ SOLN
INTRAMUSCULAR | Status: DC | PRN
  Administered 2011-05-18: 4 mg via INTRAVENOUS

## 2011-05-18 MED ORDER — ACETAMINOPHEN 325 MG PO TABS
650.0000 mg | ORAL_TABLET | Freq: Four times a day (QID) | ORAL | Status: DC | PRN
  Administered 2011-05-18 – 2011-05-22 (×4): 650 mg via ORAL
  Filled 2011-05-18 (×4): qty 2

## 2011-05-18 MED ORDER — DIPHENHYDRAMINE HCL 50 MG/ML IJ SOLN
12.5000 mg | Freq: Four times a day (QID) | INTRAMUSCULAR | Status: DC | PRN
  Filled 2011-05-18: qty 0.25

## 2011-05-18 MED ORDER — MIDAZOLAM HCL 2 MG/2ML IJ SOLN
0.5000 mg | INTRAMUSCULAR | Status: DC | PRN
  Administered 2011-05-18: 2 mg via INTRAVENOUS

## 2011-05-18 MED ORDER — DOCUSATE SODIUM 100 MG PO CAPS
100.0000 mg | ORAL_CAPSULE | Freq: Two times a day (BID) | ORAL | Status: DC
  Administered 2011-05-18 – 2011-05-23 (×10): 100 mg via ORAL
  Filled 2011-05-18 (×11): qty 1

## 2011-05-18 MED ORDER — ACETAMINOPHEN 650 MG RE SUPP: 650.0000 mg | Freq: Four times a day (QID) | RECTAL | Status: DC | PRN

## 2011-05-18 MED ORDER — MENTHOL 3 MG MT LOZG: 1.0000 | LOZENGE | OROMUCOSAL | Status: DC | PRN

## 2011-05-18 MED ORDER — CEFAZOLIN SODIUM 1-5 GM-% IV SOLN
1.0000 g | Freq: Three times a day (TID) | INTRAVENOUS | Status: AC
  Administered 2011-05-18 – 2011-05-19 (×3): 1 g via INTRAVENOUS
  Filled 2011-05-18 (×3): qty 50

## 2011-05-18 MED ORDER — LIDOCAINE HCL (CARDIAC) 20 MG/ML IV SOLN
INTRAVENOUS | Status: DC | PRN
  Administered 2011-05-18: 80 mg via INTRAVENOUS

## 2011-05-18 MED ORDER — MORPHINE SULFATE 4 MG/ML IJ SOLN: INTRAMUSCULAR | Status: DC | PRN

## 2011-05-18 MED ORDER — METOCLOPRAMIDE HCL 5 MG/ML IJ SOLN: 5.0000 mg | Freq: Three times a day (TID) | INTRAMUSCULAR | Status: DC | PRN

## 2011-05-18 MED ORDER — FLEET ENEMA 7-19 GM/118ML RE ENEM: 1.0000 | ENEMA | Freq: Once | RECTAL | Status: AC | PRN

## 2011-05-18 MED ORDER — DIPHENHYDRAMINE HCL 12.5 MG/5ML PO ELIX
12.5000 mg | ORAL_SOLUTION | Freq: Four times a day (QID) | ORAL | Status: DC | PRN
  Filled 2011-05-18: qty 5

## 2011-05-18 MED ORDER — NEOSTIGMINE METHYLSULFATE 1 MG/ML IJ SOLN
INTRAMUSCULAR | Status: DC | PRN
  Administered 2011-05-18: 1.5 mg via INTRAVENOUS

## 2011-05-18 MED ORDER — METHOCARBAMOL 500 MG PO TABS
500.0000 mg | ORAL_TABLET | Freq: Four times a day (QID) | ORAL | Status: DC | PRN
  Administered 2011-05-20 – 2011-05-22 (×8): 500 mg via ORAL
  Filled 2011-05-18 (×9): qty 1

## 2011-05-18 MED ORDER — PROPOFOL 10 MG/ML IV EMUL
INTRAVENOUS | Status: DC | PRN
  Administered 2011-05-18: 200 mg via INTRAVENOUS

## 2011-05-18 MED ORDER — CLONAZEPAM 1 MG PO TABS
1.0000 mg | ORAL_TABLET | Freq: Two times a day (BID) | ORAL | Status: DC | PRN
  Administered 2011-05-23: 1 mg via ORAL
  Filled 2011-05-18 (×2): qty 1

## 2011-05-18 MED ORDER — ONDANSETRON HCL 4 MG/2ML IJ SOLN
4.0000 mg | Freq: Four times a day (QID) | INTRAMUSCULAR | Status: DC | PRN
  Filled 2011-05-18: qty 2

## 2011-05-18 MED ORDER — WARFARIN SODIUM 7.5 MG PO TABS
7.5000 mg | ORAL_TABLET | Freq: Once | ORAL | Status: AC
  Administered 2011-05-18: 7.5 mg via ORAL
  Filled 2011-05-18: qty 1

## 2011-05-18 MED ORDER — MORPHINE SULFATE 4 MG/ML IJ SOLN: 0.0500 mg/kg | INTRAMUSCULAR | Status: DC | PRN

## 2011-05-18 MED ORDER — OXYCODONE-ACETAMINOPHEN 5-325 MG PO TABS
1.0000 | ORAL_TABLET | ORAL | Status: DC | PRN
  Administered 2011-05-19 – 2011-05-22 (×10): 2 via ORAL
  Administered 2011-05-22: 1 via ORAL
  Administered 2011-05-22 (×2): 2 via ORAL
  Administered 2011-05-22: 1 via ORAL
  Administered 2011-05-22: 2 via ORAL
  Filled 2011-05-18 (×5): qty 2
  Filled 2011-05-18: qty 1
  Filled 2011-05-18 (×10): qty 2

## 2011-05-18 MED ORDER — SODIUM CHLORIDE 0.9 % IR SOLN
Status: DC | PRN
  Administered 2011-05-18: 3000 mL
  Administered 2011-05-18: 1000 mL

## 2011-05-18 MED ORDER — VENLAFAXINE HCL ER 75 MG PO CP24
75.0000 mg | ORAL_CAPSULE | Freq: Every day | ORAL | Status: DC
  Administered 2011-05-18 – 2011-05-23 (×6): 75 mg via ORAL
  Filled 2011-05-18 (×6): qty 1

## 2011-05-18 MED ORDER — SENNOSIDES-DOCUSATE SODIUM 8.6-50 MG PO TABS: 1.0000 | ORAL_TABLET | Freq: Every evening | ORAL | Status: DC | PRN

## 2011-05-18 MED ORDER — METOCLOPRAMIDE HCL 5 MG/ML IJ SOLN
10.0000 mg | Freq: Once | INTRAMUSCULAR | Status: DC | PRN
  Filled 2011-05-18: qty 2

## 2011-05-18 MED ORDER — DEXAMETHASONE SODIUM PHOSPHATE 10 MG/ML IJ SOLN
INTRAMUSCULAR | Status: DC | PRN
  Administered 2011-05-18: 10 mg via INTRAVENOUS

## 2011-05-18 MED ORDER — DIPHENHYDRAMINE HCL 12.5 MG/5ML PO ELIX
12.5000 mg | ORAL_SOLUTION | Freq: Four times a day (QID) | ORAL | Status: DC | PRN
  Administered 2011-05-23: 12.5 mg via ORAL
  Filled 2011-05-18: qty 5
  Filled 2011-05-18: qty 10

## 2011-05-18 MED ORDER — ONDANSETRON HCL 4 MG PO TABS: 4.0000 mg | ORAL_TABLET | Freq: Four times a day (QID) | ORAL | Status: DC | PRN

## 2011-05-18 MED ORDER — MORPHINE SULFATE (PF) 1 MG/ML IV SOLN
INTRAVENOUS | Status: DC
  Administered 2011-05-19: 7 mg via INTRAVENOUS

## 2011-05-18 MED ORDER — LACTATED RINGERS IV SOLN
INTRAVENOUS | Status: DC | PRN
  Administered 2011-05-18 (×2): via INTRAVENOUS

## 2011-05-18 MED ORDER — DROPERIDOL 2.5 MG/ML IJ SOLN
INTRAMUSCULAR | Status: DC | PRN
  Administered 2011-05-18: 0.625 mg via INTRAVENOUS

## 2011-05-18 MED ORDER — LOSARTAN POTASSIUM 50 MG PO TABS: 100.0000 mg | ORAL_TABLET | Freq: Every day | ORAL | Status: DC

## 2011-05-18 MED ORDER — BUPIVACAINE HCL (PF) 0.5 % IJ SOLN
INTRAMUSCULAR | Status: DC | PRN
  Administered 2011-05-18: 25 mL

## 2011-05-18 MED ORDER — MORPHINE SULFATE 2 MG/ML IJ SOLN: 0.0500 mg/kg | INTRAMUSCULAR | Status: DC | PRN

## 2011-05-18 MED ORDER — MORPHINE SULFATE (PF) 1 MG/ML IV SOLN
INTRAVENOUS | Status: AC
  Administered 2011-05-18: 13:00:00
  Filled 2011-05-18: qty 25

## 2011-05-18 MED ORDER — METOCLOPRAMIDE HCL 10 MG PO TABS: 5.0000 mg | ORAL_TABLET | Freq: Three times a day (TID) | ORAL | Status: DC | PRN

## 2011-05-18 MED ORDER — HYDROCHLOROTHIAZIDE 25 MG PO TABS
25.0000 mg | ORAL_TABLET | Freq: Every day | ORAL | Status: DC
  Administered 2011-05-18 – 2011-05-23 (×6): 25 mg via ORAL
  Filled 2011-05-18 (×6): qty 1

## 2011-05-18 MED ORDER — PATIENT'S GUIDE TO USING COUMADIN BOOK
Freq: Once | Status: DC
  Filled 2011-05-18: qty 1

## 2011-05-18 MED ORDER — METHOCARBAMOL 100 MG/ML IJ SOLN
500.0000 mg | Freq: Four times a day (QID) | INTRAVENOUS | Status: DC | PRN
  Administered 2011-05-18: 500 mg via INTRAVENOUS
  Filled 2011-05-18: qty 5

## 2011-05-18 MED ORDER — FENTANYL CITRATE 0.05 MG/ML IJ SOLN
INTRAMUSCULAR | Status: DC | PRN
  Administered 2011-05-18 (×3): 50 ug via INTRAVENOUS
  Administered 2011-05-18: 25 ug via INTRAVENOUS
  Administered 2011-05-18 (×2): 50 ug via INTRAVENOUS
  Administered 2011-05-18: 100 ug via INTRAVENOUS
  Administered 2011-05-18: 25 ug via INTRAVENOUS
  Administered 2011-05-18: 50 ug via INTRAVENOUS

## 2011-05-18 MED ORDER — ROCURONIUM BROMIDE 100 MG/10ML IV SOLN
INTRAVENOUS | Status: DC | PRN
  Administered 2011-05-18: 50 mg via INTRAVENOUS

## 2011-05-18 MED ORDER — HYDROMORPHONE HCL PF 1 MG/ML IJ SOLN
0.2500 mg | INTRAMUSCULAR | Status: DC | PRN
  Administered 2011-05-18 (×2): 0.5 mg via INTRAVENOUS

## 2011-05-18 SURGICAL SUPPLY — 56 items
BANDAGE ESMARK 6X9 LF (GAUZE/BANDAGES/DRESSINGS) ×1 IMPLANT
BLADE SAG 18X100X1.27 (BLADE) ×4 IMPLANT
BNDG CMPR 9X6 STRL LF SNTH (GAUZE/BANDAGES/DRESSINGS) ×1
BNDG ESMARK 6X9 LF (GAUZE/BANDAGES/DRESSINGS) ×2
BOOTCOVER CLEANROOM LRG (PROTECTIVE WEAR) ×4 IMPLANT
BOWL SMART MIX CTS (DISPOSABLE) ×2 IMPLANT
CEMENT BONE SIMPLEX SPEEDSET (Cement) ×4 IMPLANT
CLOTH BEACON ORANGE TIMEOUT ST (SAFETY) ×2 IMPLANT
COVER BACK TABLE 24X17X13 BIG (DRAPES) ×2 IMPLANT
COVER SURGICAL LIGHT HANDLE (MISCELLANEOUS) ×2 IMPLANT
CUFF TOURNIQUET SINGLE 34IN LL (TOURNIQUET CUFF) ×2 IMPLANT
DRAPE EXTREMITY T 121X128X90 (DRAPE) ×2 IMPLANT
DRAPE PROXIMA HALF (DRAPES) ×2 IMPLANT
DRAPE U-SHAPE 47X51 STRL (DRAPES) ×2 IMPLANT
DRSG PAD ABDOMINAL 8X10 ST (GAUZE/BANDAGES/DRESSINGS) ×2 IMPLANT
DURAPREP 26ML APPLICATOR (WOUND CARE) ×2 IMPLANT
ELECT CAUTERY BLADE 6.4 (BLADE) ×2 IMPLANT
ELECT REM PT RETURN 9FT ADLT (ELECTROSURGICAL) ×2
ELECTRODE REM PT RTRN 9FT ADLT (ELECTROSURGICAL) ×1 IMPLANT
EVACUATOR 1/8 PVC DRAIN (DRAIN) ×2 IMPLANT
FACESHIELD LNG OPTICON STERILE (SAFETY) ×2 IMPLANT
GAUZE XEROFORM 5X9 LF (GAUZE/BANDAGES/DRESSINGS) ×2 IMPLANT
GLOVE BIOGEL PI IND STRL 8 (GLOVE) ×1 IMPLANT
GLOVE BIOGEL PI INDICATOR 8 (GLOVE) ×1
GLOVE ORTHO TXT STRL SZ7.5 (GLOVE) ×2 IMPLANT
GOWN PREVENTION PLUS XLARGE (GOWN DISPOSABLE) ×4 IMPLANT
GOWN STRL NON-REIN LRG LVL3 (GOWN DISPOSABLE) ×4 IMPLANT
GOWN STRL REIN 2XL XLG LVL4 (GOWN DISPOSABLE) ×2 IMPLANT
HANDPIECE INTERPULSE COAX TIP (DISPOSABLE) ×2
IMMOBILIZER KNEE 22 UNIV (SOFTGOODS) ×2 IMPLANT
IMMOBILIZER KNEE 24 THIGH 36 (MISCELLANEOUS) IMPLANT
IMMOBILIZER KNEE 24 UNIV (MISCELLANEOUS)
KIT BASIN OR (CUSTOM PROCEDURE TRAY) ×2 IMPLANT
KIT ROOM TURNOVER OR (KITS) ×2 IMPLANT
MANIFOLD NEPTUNE II (INSTRUMENTS) ×2 IMPLANT
NS IRRIG 1000ML POUR BTL (IV SOLUTION) ×2 IMPLANT
PACK TOTAL JOINT (CUSTOM PROCEDURE TRAY) ×2 IMPLANT
PAD ARMBOARD 7.5X6 YLW CONV (MISCELLANEOUS) ×4 IMPLANT
PAD CAST 4YDX4 CTTN HI CHSV (CAST SUPPLIES) ×1 IMPLANT
PADDING CAST COTTON 4X4 STRL (CAST SUPPLIES) ×2
PADDING CAST COTTON 6X4 STRL (CAST SUPPLIES) ×2 IMPLANT
RUBBERBAND STERILE (MISCELLANEOUS) ×2 IMPLANT
SET HNDPC FAN SPRY TIP SCT (DISPOSABLE) ×1 IMPLANT
SPONGE GAUZE 4X4 12PLY (GAUZE/BANDAGES/DRESSINGS) ×2 IMPLANT
STAPLER VISISTAT 35W (STAPLE) ×2 IMPLANT
SUCTION FRAZIER TIP 10 FR DISP (SUCTIONS) ×2 IMPLANT
SUT VIC AB 1 CTX 36 (SUTURE) ×4
SUT VIC AB 1 CTX36XBRD ANBCTR (SUTURE) ×2 IMPLANT
SUT VIC AB 2-0 CT1 27 (SUTURE) ×4
SUT VIC AB 2-0 CT1 TAPERPNT 27 (SUTURE) ×2 IMPLANT
SYR 30ML LL (SYRINGE) ×2 IMPLANT
SYR 30ML SLIP (SYRINGE) ×2 IMPLANT
TOWEL OR 17X24 6PK STRL BLUE (TOWEL DISPOSABLE) ×2 IMPLANT
TOWEL OR 17X26 10 PK STRL BLUE (TOWEL DISPOSABLE) ×2 IMPLANT
TRAY FOLEY CATH 14FR (SET/KITS/TRAYS/PACK) ×2 IMPLANT
WATER STERILE IRR 1000ML POUR (IV SOLUTION) ×4 IMPLANT

## 2011-05-18 NOTE — Interval H&P Note (Signed)
History and Physical Interval Note:  05/18/2011 8:20 AM  [name]  has presented today for surgery, with the diagnosis of DJD LT KNEE  The various methods of treatment have been discussed with the patient and family. After consideration of risks, benefits and other options for treatment, the patient has consented to  Procedure(s) (LRB): TOTAL KNEE ARTHROPLASTY (Left) as a surgical intervention .  The patients' history has been reviewed, patient examined, no change in status, stable for surgery.  I have reviewed the patients' chart and labs.  Questions were answered to the patient's satisfaction.     Calli Bashor F

## 2011-05-18 NOTE — Progress Notes (Signed)
At 0800 pt started complaining of epigastric pain on left side radiating to left arm, and throat and then later to right chest. Pt was getting anxious and diaphoretic.  VS stable from previous (pulse 62 BP 128/71 O2 sat 99%) but pt complaining a lot so we did an EKG showing NSR.  Pt instructed to take slow deep breaths and started to relax. Pain currently subsided.  Pt states she thinks it might be because she ate popcorn and corn on the cob yesterday and she has diverticulitis.  Currently comfortable and without chest pain.

## 2011-05-18 NOTE — Anesthesia Postprocedure Evaluation (Signed)
  Anesthesia Post-op Note  Patient: [name]  Procedure(s) Performed: Procedure(s) (LRB): TOTAL KNEE ARTHROPLASTY (Left)  Patient Location: PACU  Anesthesia Type: GA combined with regional for post-op pain  Level of Consciousness: awake, alert , oriented and patient cooperative  Airway and Oxygen Therapy: Patient Spontanous Breathing and Patient connected to nasal cannula oxygen  Post-op Pain: mild  Post-op Assessment: Post-op Vital signs reviewed, Patient's Cardiovascular Status Stable, Respiratory Function Stable, Patent Airway, No signs of Nausea or vomiting and Pain level controlled  Post-op Vital Signs: stable  Complications: No apparent anesthesia complications

## 2011-05-18 NOTE — Anesthesia Procedure Notes (Signed)
Anesthesia Regional Block:  Femoral nerve block  Pre-Anesthetic Checklist: ,, timeout performed, Correct Patient, Correct Site, Correct Laterality, Correct Procedure, Correct Position, site marked, Risks and benefits discussed,  Surgical consent,  Pre-op evaluation,  At surgeon's request and post-op pain management  Laterality: Left  Prep: chloraprep       Needles:   Needle Type: Other   (Arrow Echogenic)   Needle Length: 9cm  Needle Gauge: 21    Additional Needles:  Procedures: ultrasound guided Femoral nerve block Narrative:  Start time: 05/18/2011 8:40 AM End time: 05/18/2011 8:46 AM Injection made incrementally with aspirations every 5 mL.  Performed by: Personally  Anesthesiologist: C. Damia Bobrowski MD  Additional Notes: Ultrasound guidance used to: id relevant anatomy, confirm needle position, local anesthetic spread, avoidance of vascular puncture. Picture saved. No complications. Block performed personally by Janetta Hora. Gelene Mink, MD    Femoral nerve block

## 2011-05-18 NOTE — Preoperative (Signed)
Beta Blockers   Reason not to administer Beta Blockers:Not Applicable 

## 2011-05-18 NOTE — Anesthesia Preprocedure Evaluation (Signed)
Anesthesia Evaluation  Patient identified by MRN, date of birth, ID band Patient awake    Reviewed: Allergy & Precautions, H&P , NPO status , Patient's Chart, lab work & pertinent test results, reviewed documented beta blocker date and time   Airway Mallampati: II TM Distance: >3 FB Neck ROM: full    Dental   Pulmonary asthma , pneumonia ,          Cardiovascular negative cardio ROS      Neuro/Psych PSYCHIATRIC DISORDERS  Neuromuscular disease    GI/Hepatic negative GI ROS, Neg liver ROS,   Endo/Other  negative endocrine ROSMorbid obesity  Renal/GU negative Renal ROS  negative genitourinary   Musculoskeletal   Abdominal   Peds  Hematology negative hematology ROS (+)   Anesthesia Other Findings See surgeon's H&P   Repeat EKG for eval of anxiety / dietary related chest discomfort. No changes noted. Patient without complaints.  Reproductive/Obstetrics negative OB ROS                           Anesthesia Physical Anesthesia Plan  ASA: III  Anesthesia Plan: General   Post-op Pain Management:    Induction: Intravenous  Airway Management Planned: Oral ETT  Additional Equipment:   Intra-op Plan:   Post-operative Plan: Extubation in OR  Informed Consent: I have reviewed the patients History and Physical, chart, labs and discussed the procedure including the risks, benefits and alternatives for the proposed anesthesia with the patient or authorized representative who has indicated his/her understanding and acceptance.     Plan Discussed with: CRNA and Surgeon  Anesthesia Plan Comments:         Anesthesia Quick Evaluation

## 2011-05-18 NOTE — Progress Notes (Signed)
Spoke with Dr Michelle Piper re:pts chest pain and he said he will evaluate her when she gets to holding.

## 2011-05-18 NOTE — Brief Op Note (Signed)
05/18/2011  1:23 PM  PATIENT:  [name]  61 y.o. female  PRE-OPERATIVE DIAGNOSIS:  DJD LT KNEE  POST-OPERATIVE DIAGNOSIS:  DJD LT KNEE  PROCEDURE:  Procedure(s) (LRB): TOTAL KNEE ARTHROPLASTY (Left)  SURGEON:  Surgeon(s) and Role:    * Loreta Ave, MD - Primary  PHYSICIAN ASSISTANT: Zonia Kief M     ANESTHESIA:   regional and general  EBL:  Total I/O In: 1600 [I.V.:1600] Out: 225 [Urine:200; Blood:25]  BLOOD ADMINISTERED:none   SPECIMEN:  No Specimen  DISPOSITION OF SPECIMEN:  N/A  COUNTS:  YES  TOURNIQUET:   Total Tourniquet Time Documented: Thigh (Left) - 84 minutes   PATIENT DISPOSITION:  PACU - hemodynamically stable.

## 2011-05-18 NOTE — Progress Notes (Signed)
ANTICOAGULATION CONSULT NOTE - Initial Consult  Pharmacy Consult:  Coumadin Indication:  VTE prophylaxis s/p left TKA  Allergies  Allergen Reactions  . Lyrica Nausea And Vomiting  . Shellfish Allergy Itching and Other (See Comments)    Severe hives    Patient Measurements: Weight = 98.6 kg  Vital Signs: Temp: 98.5 F (36.9 C) (04/03 1447) BP: 130/79 mmHg (04/03 1447) Pulse Rate: 74  (04/03 1447)  Labs: No results found for this basename: HGB:2,HCT:3,PLT:3,APTT:3,LABPROT:3,INR:3,HEPARINUNFRC:3,CREATININE:3,CKTOTAL:3,CKMB:3,TROPONINI:3 in the last 72 hours CrCl is unknown because there is no height on file for the current visit.  Medical History: Past Medical History  Diagnosis Date  . Asthma     ashmatic bronchitis hx  . Pneumonia     hx  . Anemia     hx  . Blood transfusion     gallbladder 12  . Fibromyalgia   . Depression   . Arthritis     working on L knee, but right knee also bad      Assessment: 60 YOF s/p left TKA to start Coumadin and Lovenox for VTE px.  Baseline INR 1.  Goal of Therapy:  INR 2 - 3    Plan:  - Coumadin 7.5mg  PO today - Continue Lovenox 30mg  SQ Q12H until INR therapeutic - Daily PT/INR - Coumadin book / video    Erisa Mehlman D. Laney Potash, PharmD, BCPS Pager:  [phone] 05/18/2011, 3:41 PM

## 2011-05-18 NOTE — Transfer of Care (Signed)
Immediate Anesthesia Transfer of Care Note  Patient: [name]  Procedure(s) Performed: Procedure(s) (LRB): TOTAL KNEE ARTHROPLASTY (Left)  Patient Location: PACU  Anesthesia Type: General  Level of Consciousness: awake, alert  and oriented  Airway & Oxygen Therapy: Patient Spontanous Breathing and Patient connected to face mask oxygen  Post-op Assessment: Report given to PACU RN, Post -op Vital signs reviewed and stable and Patient moving all extremities X 4  Post vital signs: Reviewed and stable  Complications: No apparent anesthesia complications

## 2011-05-19 ENCOUNTER — Encounter (HOSPITAL_COMMUNITY): Payer: Self-pay | Admitting: Orthopedic Surgery

## 2011-05-19 LAB — BASIC METABOLIC PANEL
BUN: 17 mg/dL (ref 6–23)
CO2: 27 mEq/L (ref 19–32)
Calcium: 9.5 mg/dL (ref 8.4–10.5)
Creatinine, Ser: 0.78 mg/dL (ref 0.50–1.10)
Glucose, Bld: 134 mg/dL — ABNORMAL HIGH (ref 70–99)

## 2011-05-19 LAB — CBC
Hemoglobin: 8.8 g/dL — ABNORMAL LOW (ref 12.0–15.0)
MCH: 29.1 pg (ref 26.0–34.0)
MCV: 87.1 fL (ref 78.0–100.0)
WBC: 9 10*3/uL (ref 4.0–10.5)

## 2011-05-19 LAB — PROTIME-INR
INR: 1.21 (ref 0.00–1.49)
Prothrombin Time: 15.6 seconds — ABNORMAL HIGH (ref 11.6–15.2)

## 2011-05-19 MED ORDER — WARFARIN SODIUM 7.5 MG PO TABS
7.5000 mg | ORAL_TABLET | Freq: Once | ORAL | Status: DC
Start: 2011-05-19 — End: 2011-05-19
  Filled 2011-05-19: qty 1

## 2011-05-19 MED ORDER — MORPHINE SULFATE (PF) 1 MG/ML IV SOLN
INTRAVENOUS | Status: AC
  Administered 2011-05-19: 04:00:00
  Filled 2011-05-19: qty 25

## 2011-05-19 MED FILL — Morphine Sulfate Inj 4 MG/ML: INTRAMUSCULAR | Qty: 1 | Status: AC

## 2011-05-19 NOTE — Progress Notes (Signed)
Physical Therapy Evaluation Patient Details Name: [name] MRN: [id number] DOB: [birth year] Today's Date: 05/19/2011  Problem List: There is no problem list on file for this patient.   Past Medical History:  Past Medical History  Diagnosis Date  . Asthma     ashmatic bronchitis hx  . Pneumonia     hx  . Anemia     hx  . Blood transfusion     gallbladder 12  . Fibromyalgia   . Depression   . Arthritis     working on L knee, but right knee also bad   Past Surgical History:  Past Surgical History  Procedure Date  . Cholecystectomy   . Tonsillectomy   . Abdominal hysterectomy   . Knee arthroscopy rt  . Knee arthroplasty 02    rt      PT Assessment/Plan/Recommendation PT Assessment Clinical Impression Statement: Patient s/p L TKA presenting with decreased L LE strength and active/passive L knee ROM. Patient lived alone prior surgery and would benefit from SNF upon d/c to maximize functional recovery and to allow for 24/7 assist/supervision for safety for all mobility and ADLs.  PT Recommendation/Assessment: Patient will need skilled PT in the acute care venue PT Problem List: Decreased strength;Decreased range of motion;Decreased activity tolerance;Decreased balance;Decreased mobility;Decreased coordination PT Therapy Diagnosis : Difficulty walking;Generalized weakness;Abnormality of gait;Acute pain PT Plan PT Frequency: 7X/week PT Treatment/Interventions: Gait training;DME instruction;Functional mobility training;Stair training;Therapeutic activities;Therapeutic exercise;Balance training PT Recommendation Follow Up Recommendations: Skilled nursing facility;Supervision/Assistance - 24 hour Equipment Recommended: Defer to next venue PT Goals  Acute Rehab PT Goals PT Goal Formulation: With patient Time For Goal Achievement: 7 days Pt will go Supine/Side to Sit: with modified independence PT Goal: Supine/Side to Sit - Progress: Goal set today Pt will go Sit to  Stand: with modified independence PT Goal: Sit to Stand - Progress: Goal set today Pt will Ambulate: >150 feet;with modified independence;with rolling walker PT Goal: Ambulate - Progress: Goal set today Pt will Perform Home Exercise Program: Independently PT Goal: Perform Home Exercise Program - Progress: Goal set today  PT Evaluation Precautions/Restrictions  Precautions Precautions: Knee Required Braces or Orthoses: Yes Knee Immobilizer: On when out of bed or walking Restrictions Weight Bearing Restrictions: Yes LLE Weight Bearing: Weight bearing as tolerated Prior Functioning  Home Living Lives With: Alone Receives Help From: Friend(s) Type of Home: House Home Layout: One level Home Access: Stairs to enter Entrance Stairs-Rails: None Entrance Stairs-Number of Steps: 2 Bathroom Shower/Tub: Psychologist, counselling;Tub/shower unit Bathroom Toilet: Standard Bathroom Accessibility: Yes How Accessible: Accessible via walker Home Adaptive Equipment: Walker - rolling;Shower chair with back Additional Comments: pt reports "I hope to go to [facility]" at d/c Prior Function Level of Independence: Independent with basic ADLs;Independent with gait;Independent with transfers (assist for laundry and cooking) Able to Take Stairs?: Yes Driving: Yes Vocation: Full time employment Cognition Cognition Arousal/Alertness: Awake/alert Overall Cognitive Status: Appears within functional limits for tasks assessed Orientation Level: Oriented X4 Pain: 8/10 L knee  Sensation/Coordination Sensation Light Touch: Appears Intact Extremity Assessment RUE Assessment RUE Assessment: Within Functional Limits LUE Assessment LUE Assessment: Within Functional Limits RLE Assessment RLE Assessment: Within Functional Limits LLE Assessment LLE Assessment:  (faint L quad set) Mobility (including Balance) Bed Mobility Bed Mobility: Yes Supine to Sit: 3: Mod assist;With rails;HOB flat Supine to Sit Details  (indicate cue type and reason): assist at trunk and L LE Transfers Transfers: Yes Sit to Stand: 2: Max assist;Patient percentage (comment);From bed (Pt = 60%) Sit to  Stand Details (indicate cue type and reason): v/c's for hand placement, assist to achieve upright posture, max v/c's to use UEs Ambulation/Gait Ambulation/Gait: Yes Ambulation/Gait Assistance: 5: Supervision Ambulation/Gait Assistance Details (indicate cue type and reason): v/c's for walker sequencing Ambulation Distance (Feet): 12 Feet Assistive device: Rolling walker (L KI) Gait Pattern: Step-to pattern;Decreased step length - left;Decreased stance time - left;Decreased stride length;Antalgic (increased bilat UE support) Gait velocity: increased time, freq standing rest breaks Stairs: No    Exercise  Total Joint Exercises Ankle Circles/Pumps: AROM;Both;10 reps Quad Sets: AROM;Left;10 reps;Supine End of Session PT - End of Session Equipment Utilized During Treatment: Gait belt;Left knee immobilizer Activity Tolerance: Patient limited by fatigue Patient left: in chair;with call bell in reach Nurse Communication: Mobility status for transfers;Mobility status for ambulation General Behavior During Session: [facility] for tasks performed Cognition: [facility] for tasks performed  Marcene Brawn 05/19/2011, 12:34 PM  Lewis Shock, PT, DPT Pager #: [phone] Office #: [phone]

## 2011-05-19 NOTE — Progress Notes (Signed)
Physical Therapy Treatment Note   05/19/11 1425  PT Visit Information  Last PT Received On 05/19/11  Precautions  Precautions Knee  Knee Immobilizer On when out of bed or walking  Restrictions  LLE Weight Bearing WBAT  Bed Mobility  Sit to Supine 4: Min assist  Sit to Supine - Details (indicate cue type and reason) max directional v/c's for sequence, assist for L LE  Transfers  Sit to Stand 4: Min assist  Sit to Stand Details (indicate cue type and reason) v/c's fro hand palcement  Stand Pivot Transfers 4: Min assist  Stand Pivot Transfer Details (indicate cue type and reason) v/c's for sequencing and walker management, transfer from chair to bed  Total Joint Exercises  Ankle Circles/Pumps AROM;Both;10 reps;Supine  Quad Sets AROM;Left;10 reps;Supine  Heel Slides Left;AAROM;10 reps;Seated (achieve approx 45 deg L knee flexion)  Knee Flexion AAROM;Left;10 reps;Supine (approx 30 deg L knee flex)  PT - End of Session  Equipment Utilized During Treatment Gait belt;Left knee immobilizer  Activity Tolerance Patient tolerated treatment well  Patient left in chair;in CPM;with call bell in reach;with family/visitor present (CPM set 0-70 degrees)  Nurse Communication Mobility status for transfers;Mobility status for ambulation (RN made aware pt in CPM)  General  Behavior During Session [facility] for tasks performed  Cognition [facility] for tasks performed  PT - Assessment/Plan  Comments on Treatment Session Patient con't to have decreased abiltiy to actively complete L quad set and limited active/passive L knee ROM due to pain.   PT Plan Discharge plan remains appropriate;Frequency remains appropriate  PT Frequency 7X/week  Follow Up Recommendations Skilled nursing facility;Supervision/Assistance - 24 hour  Equipment Recommended Defer to next venue  Acute Rehab PT Goals  PT Goal Formulation With patient  PT Goal: Supine/Side to Sit - Progress Progressing toward goal  PT Goal: Sit to Stand -  Progress Progressing toward goal  PT Goal: Ambulate - Progress Progressing toward goal  PT Goal: Perform Home Exercise Program - Progress Progressing toward goal    Pain: 8/10 L knee pain  Lewis Shock, PT, DPT Pager #: [phone] Office #: [phone]

## 2011-05-19 NOTE — Progress Notes (Signed)
UR COMPLETED  

## 2011-05-19 NOTE — Progress Notes (Addendum)
Clinical Social Work Department CLINICAL SOCIAL WORK PLACEMENT NOTE 05/19/2011  Patient:  [name], [name]  Account Number:  [id number] Admit date:  05/18/2011  Clinical Social Worker:  Peggyann Shoals  Date/time:  05/19/2011 03:00 PM  Clinical Social Work is seeking post-discharge placement for this patient at the following level of care:   SKILLED NURSING   (*CSW will update this form in Epic as items are completed)   05/19/2011  Patient/family provided with [facility] Department of Clinical Social Work's list of facilities offering this level of care within the geographic area requested by the patient (or if unable, by the patient's family).  05/19/2011  Patient/family informed of their freedom to choose among providers that offer the needed level of care, that participate in Medicare, Medicaid or managed care program needed by the patient, have an available bed and are willing to accept the patient.  05/19/2011  Patient/family informed of [facility]' ownership interest in [facility], as well as of the fact that they are under no obligation to receive care at this facility.  PASARR submitted to EDS on 05/19/2011 PASARR number received from EDS on 05/19/11  FL2 transmitted to all facilities in geographic area requested by pt/family on  05/19/2011 FL2 transmitted to all facilities within larger geographic area on   Patient informed that his/her managed care company has contracts with or will negotiate with  certain facilities, including the following:     Patient/family informed of bed offers received:  05/20/11 Patient chooses bed at [facility]  Physician recommends and patient chooses bed at  [facility]  Patient to be transferred to [facility]  On 05/23/11   Patient to be transferred to facility by [facility]  The following physician request were entered in Epic:   Additional Comments:  Dede Query, MSW, LCSWA

## 2011-05-19 NOTE — Op Note (Signed)
NAMEKEARY, HANAK NO.:  [id number]  MEDICAL RECORD NO.:  [id number]  LOCATION:  5012                         FACILITY:  [facility]  PHYSICIAN:  Loreta Ave, M.D. DATE OF BIRTH:  1950-03-17  DATE OF PROCEDURE: DATE OF DISCHARGE:                              OPERATIVE REPORT   PREOPERATIVE DIAGNOSES:  End-stage degenerative arthritis, left knee. Valgus alignment with bone loss, lateral femoral condyle.  POSTOPERATIVE DIAGNOSES:  End-stage degenerative arthritis, left knee. Valgus alignment with bone loss, lateral femoral condyle.  PROCEDURE:  Left knee modified minimally invasive total knee replacement, Stryker triathlon prosthesis.  Soft tissue balancing. Cemented pegged posterior stabilized #3 femoral component.  Cemented #4 tibial component, 9-mm polyethylene insert.  Cemented resurfacing 32-mm patellar component.  SURGEON:  Loreta Ave, MD  ASSISTANT:  Genene Churn. Barry Dienes, [place], present throughout the entire case and necessary for timely completion of the procedure.  ANESTHESIA:  General.  BLOOD LOSS:  Minimal.  SPECIMENS:  None.  CULTURES:  None.  COMPLICATIONS:  None.  DRESSINGS:  Soft compressive with knee immobilizer.  TOURNIQUET TIME:  50 minutes.  PROCEDURE:  The patient was brought to the operating room and placed on the operating room table in supine position.  After adequate anesthesia had been obtained, left knee examined.  Valgus alignment partially correctable, almost full extension, flexion 90 degrees.  Tourniquet applied, prepped and draped in usual sterile fashion.  Exsanguinated with elevation of Esmarch.  Tourniquet inflated to 350 mmHg.  Straight incision above the patella down to tibial tubercle.  Medial arthrotomy, vastus splitting, preserving quad tendon.  Medial capsule release, not too generous.  Knee exposed.  Marked grade 4 change throughout.  Bone loss, especially posterior half of the lateral femoral  condyle. Remnants of menisci, cruciate ligaments, loose bodies, spurs removed. Internal release on the lateral side and freed that side up.  Distal femur exposed.  Intramedullary guide placed.  8 mm resection, 5 degrees of valgus.  Using epicondylar axis, the femur was sized, cut, and fitted for a pegged # 3 component.  Proximal tibial resection below the defect laterally.  Sclerotic bone there treated with multiple drilling.  Size #4 component.  Patella exposed, posterior 10 mm removed.  Drilled, sized, and fitted for a 32-mm component.  After clearing out debris in flexion and extension, the knee was measured and nicely balanced in flexion and extension.  Trials put in place.  #3 above, #4 below 9-mm insert, 32 patella.  With this construct, I was very pleased with biomechanical axis, patellofemoral tracking, full extension, full flexion.  Tibia was marked for rotation using trials and hand reamed. All trials removed.  Copious irrigation with a pulse irrigating device. Cement prepared, placed on all components, firmly seated.  Polyethylene attached to tibia, knee reduced.  Patella held with clamp.  Once cement hardened, re-examined and again pleased with alignment, stability, and tracking.  Hemovac was placed through a separate stab wound.  Arthrotomy closed with #1 Vicryl, skin and subcutaneous tissue with Vicryl and staples.  Sterile compressive dressing applied.  Tourniquet deflated and removed.  Knee immobilizer applied.  Anesthesia reversed.  Brought to the recovery room.  Tolerated the surgery well.  No  complications.     Loreta Ave, M.D.     DFM/MEDQ  D:  05/18/2011  T:  05/19/2011  Job:  [id number]

## 2011-05-19 NOTE — Progress Notes (Signed)
ANTICOAGULATION CONSULT NOTE - Follow Up Consult  Pharmacy Consult: Coumadin  Indication: VTE prophylaxis s/p left TKA   Allergies  Allergen Reactions  . Lyrica Nausea And Vomiting  . Shellfish Allergy Itching and Other (See Comments)    Severe hives    Patient Measurements: Weight = 98.6 kg  Vital Signs: Temp: 98.3 F (36.8 C) (04/04 0503) BP: 128/58 mmHg (04/04 0503) Pulse Rate: 88  (04/04 0503)  Labs:  Basename 05/19/11 0648  HGB 8.8*  HCT 26.3*  PLT 222  APTT --  LABPROT 15.6*  INR 1.21  HEPARINUNFRC --  CREATININE 0.78  CKTOTAL --  CKMB --  TROPONINI --   CrCl is unknown because there is no height on file for the current visit.    Assessment: 28 YOF s/p left TKA to start Coumadin and Lovenox for VTE px. INR subtherapeutic, no bleeding noted.  Goal of Therapy:  INR 2 - 3   Plan:  - Repeat Coumadin 7.5mg  PO today - Continue Lovenox 30mg  SQ Q12H until INR therapeutic - Daily PT/INR   Guilherme Schwenke D. Laney Potash, PharmD, BCPS Pager:  [phone] 05/19/2011, 9:10 AM

## 2011-05-19 NOTE — Progress Notes (Signed)
Subjective: Doing well.  Pain controlled.  States she has hx of anemia.  Worked up by GI several years ago.  Diagnosed with diverticulosis and says this was thought to be contributing.  Has not been back for evaluation.  No complaint of melena or hematochezia.     Objective: Vital signs in last 24 hours: Temp:  [97 F (36.1 C)-98.5 F (36.9 C)] 98.3 F (36.8 C) (04/04 0503) Pulse Rate:  [74-101] 88  (04/04 0503) Resp:  [16-22] 18  (04/04 0503) BP: (123-142)/(48-82) 128/58 mmHg (04/04 0503) SpO2:  [97 %-99 %] 99 % (04/04 0503)  Intake/Output from previous day: 04/03 0701 - 04/04 0700 In: 4230 [P.O.:2080; I.V.:1800] Out: 3825 [Urine:3700; Drains:100; Blood:25] Intake/Output this shift:     Basename 05/19/11 0648  HGB 8.8*    Basename 05/19/11 0648  WBC 9.0  RBC 3.02*  HCT 26.3*  PLT 222    Basename 05/19/11 0648  NA 138  K 4.0  CL 101  CO2 27  BUN 17  CREATININE 0.78  GLUCOSE 134*  CALCIUM 9.5    Basename 05/19/11 0648  LABPT --  INR 1.21    Exam:  Dressing c/d/i.  Calf nt, nvi.    Assessment/Plan: Hx of chronic anemia.  Patient not really sure as to what is causing.  Will d/c coumadin and only use lovenox for dvt prophylaxis.  Needs snf for rehab.  Prefers [facility].  Will check h/h later today.  May need transfusion prbc's.    Lilie Vezina M 05/19/2011, 9:18 AM

## 2011-05-19 NOTE — Progress Notes (Signed)
Chart reviewed.  Note that pt. Is planning for SNF level rehab at Discharge.  Will defer OT eval to SNF.  Jeani Hawking, OTR/L [phone]

## 2011-05-19 NOTE — Progress Notes (Signed)
Clinical Social Work Department BRIEF PSYCHOSOCIAL ASSESSMENT 05/19/2011  Patient:  [name], [name]     Account Number:  [id number]     Admit date:  05/18/2011  Clinical Social Worker:  Peggyann Shoals  Date/Time:  05/19/2011 03:00 PM  Referred by:  Physician  Date Referred:  05/19/2011 Referred for  SNF Placement   Other Referral:   Interview type:  Patient Other interview type:    PSYCHOSOCIAL DATA Living Status:  ALONE Admitted from facility:   Level of care:   Primary support name:  [name] Primary support relationship to patient:  FRIEND Degree of support available:   supportive, [phone]. Loreen Freud, supportive, [phone]    CURRENT CONCERNS Current Concerns  Post-Acute Placement   Other Concerns:    SOCIAL WORK ASSESSMENT / PLAN CSW met with pt to address consult. Pt shared that she would like SNF at discharge as she lives alone. Pt reported that she does not have family in the area but a very supportive friend and co-worker network who is able to assist   Assessment/plan status:  Other - See comment Other assessment/ plan:   CSW will initate SNF search and follow up with bed offers. CSW will also seek prior approval with pt's insurance company if needed.   Information/referral to community resources:   as needed    PATIENT'S/FAMILY'S RESPONSE TO PLAN OF CARE: Pt was very pleasant and agreeable to discharge plan.    Dede Query, MSW, Theresia Majors [phone]

## 2011-05-20 LAB — BASIC METABOLIC PANEL
BUN: 19 mg/dL (ref 6–23)
Calcium: 9.5 mg/dL (ref 8.4–10.5)
GFR calc non Af Amer: 79 mL/min — ABNORMAL LOW (ref >90)
Glucose, Bld: 135 mg/dL — ABNORMAL HIGH (ref 70–99)
Potassium: 3.9 mEq/L (ref 3.5–5.1)

## 2011-05-20 LAB — CBC
HCT: 25.1 % — ABNORMAL LOW (ref 36.0–46.0)
Hemoglobin: 8.4 g/dL — ABNORMAL LOW (ref 12.0–15.0)
MCH: 28.6 pg (ref 26.0–34.0)
MCHC: 33.5 g/dL (ref 30.0–36.0)

## 2011-05-20 NOTE — Progress Notes (Signed)
Patient ID: [name], female   DOB: 1950/03/17, 61 y.o.   MRN: [id number]   Priority D/C Summary #  [phone]

## 2011-05-20 NOTE — Progress Notes (Signed)
Subjective: Doing well.  Pain controlled.    Objective: Vital signs in last 24 hours: Temp:  [98.6 F (37 C)-99.5 F (37.5 C)] 98.7 F (37.1 C) (04/05 1314) Pulse Rate:  [103-116] 116  (04/05 1314) Resp:  [16-18] 18  (04/05 1314) BP: (124-137)/(44-66) 134/66 mmHg (04/05 1314) SpO2:  [97 %-98 %] 98 % (04/05 1314)  Intake/Output from previous day: 04/04 0701 - 04/05 0700 In: 240 [P.O.:240] Out: 750 [Urine:600; Drains:150] Intake/Output this shift:     Basename 05/20/11 0601 05/19/11 1258 05/19/11 0648  HGB 8.4* 8.7* 8.8*    Basename 05/20/11 0601 05/19/11 1258 05/19/11 0648  WBC 10.6* -- 9.0  RBC 2.94* -- 3.02*  HCT 25.1* 25.9* --  PLT 199 -- 222    Basename 05/20/11 0601 05/19/11 0648  NA 133* 138  K 3.9 4.0  CL 98 101  CO2 26 27  BUN 19 17  CREATININE 0.81 0.78  GLUCOSE 135* 134*  CALCIUM 9.5 9.5    Basename 05/19/11 0648  LABPT --  INR 1.21    Exam:  Wound looks good.  Staples intact.  No drainage or signs of infection.  Drain removed.  Calf nt, nvi.    Assessment/Plan: Awaiting snf placement.  Recheck H/H.  ? Transfusion prbc's.   Saline lock iv.     Laneka Mcgrory M 05/20/2011, 2:34 PM

## 2011-05-20 NOTE — Progress Notes (Signed)
Physical Therapy Treatment Note   05/20/11 1315  PT Visit Information  Last PT Received On 05/20/11  Precautions  Precautions Knee  Knee Immobilizer On when out of bed or walking  Restrictions  LLE Weight Bearing WBAT  Bed Mobility  Sit to Supine 4: Min assist  Sit to Supine - Details (indicate cue type and reason) assist for L LE, v/c's for sequencing  Transfers  Sit to Stand 4: Min assist  Sit to Stand Details (indicate cue type and reason) v/c's for safety  Ambulation/Gait  Ambulation/Gait Assistance 4: Min assist  Ambulation/Gait Assistance Details (indicate cue type and reason) improved ambulation tolerance this PM  Ambulation Distance (Feet) 40 Feet  Assistive device Rolling walker  Gait Pattern Step-to pattern;Decreased step length - left;Decreased stance time - left  Gait velocity decreased  Wheelchair Mobility  Wheelchair Mobility No  Posture/Postural Control  Posture/Postural Control No significant limitations  Balance  Balance Assessed No  Total Joint Exercises  Knee Flexion AAROM;Left;10 reps;Seated (acheived approx 65 degrees AA L knee flex)  Long Arc Quad AROM;10 reps;Left;Seated  PT - End of Session  Equipment Utilized During Treatment Gait belt;Left knee immobilizer  Activity Tolerance Patient tolerated treatment well  Patient left with call bell in reach;with family/visitor present;in bed;in CPM (RN aide notified, cpm set to 70)  Nurse Communication Mobility status for transfers;Mobility status for ambulation  General  Behavior During Session [facility] for tasks performed  Cognition [facility] for tasks performed  PT - Assessment/Plan  Comments on Treatment Session Patient with improved ambulation tolerance this date. Patient con't to have limited active L knee flexion/ext.  PT Plan Discharge plan remains appropriate;Frequency remains appropriate  PT Frequency 7X/week  Follow Up Recommendations Skilled nursing facility  Equipment Recommended Defer to next venue    Acute Rehab PT Goals  PT Goal: Sit to Stand - Progress Progressing toward goal  PT Goal: Ambulate - Progress Progressing toward goal  PT Goal: Perform Home Exercise Program - Progress Progressing toward goal    Pain: 5/10 L knee pain  Lewis Shock, PT, DPT Pager #: [phone] Office #: [phone]

## 2011-05-20 NOTE — Discharge Summary (Signed)
[name], GASCA NO.:  [id number]  MEDICAL RECORD NO.:  [id number]  LOCATION:  5012                         FACILITY:  [facility]  PHYSICIAN:  Loreta Ave, M.D. DATE OF BIRTH:  [birth year]  DATE OF ADMISSION:  05/18/2011 DATE OF DISCHARGE:                              DISCHARGE SUMMARY   FINAL DIAGNOSES: 1. Status post left total knee replacement for end-stage degenerative     joint disease. 2. Asthma. 3. History of chronic anemia. 4. Diverticulosis. 5. Fibromyalgia. 6. Depression.  HISTORY OF PRESENT ILLNESS:  This is a 60 year old black female with history of end-stage DJD, left knee and chronic pain presented to our office for preop evaluation for total knee replacement.  She had progressively worsening pain with failed response with conservative treatment.  She had significant decrease in her daily activities due to the ongoing complaint.  HOSPITAL COURSE:  On May 18, 2011, the patient was taken to the [facility] OR and a left total knee replacement procedure was performed. Surgeon, Mckinley Jewel, MD and assistant, Zonia Kief, PA-C.  Anesthesia was general with femoral nerve block.  EBL, minimal.  No specimens or cultures.  Tourniquet time, 84 minutes.  There were no surgical or anesthesia complications, and the patient was transferred to recovery in stable condition.  Lovenox was started for DVT prophylaxis.  The patient has a history of chronic anemia and states that she was told several years ago after a GI workup that diverticulosis may be a contributing factor.  She denies any complaints of melena or hematochezia.  May 19, 2011, the patient was doing well with good pain control.  Vital signs stable, afebrile.  Hemoglobin 8.8 and hematocrit 26.3.  Preop hemoglobin was 9.9.  Glucose 134.  INR 1.21.  Knee dressing, clean, dry, and intact.  Calf nontender neurovascularly.  Skin warm and dry.  The patient will be needing transfer to a skilled  nursing facility for rehab, as she does live alone.  She prefers [facility].  We will continue to monitor hemoglobin and she may need transfusion of packed red blood cells.  May 20, 2011, the patient again doing well.  Pain controlled.  She did have some discomfort in the CPM machine last night. Knee wound looks good and staples intact.  No drainage or signs of infection.  Calf nontender neurovascularly.  Skin warm and dry. Hemoglobin 8.4, hematocrit 25.1.  Sodium 133, glucose 135.  Waiting discharge planning.  We will recheck hemoglobin this afternoon. Question needing transfusion of packed red blood cells.  Saline locked IV.  CONDITION:  Stable.  DISPOSITION:  Transfer to skilled nursing facility for rehab.  DISCHARGE MEDICATIONS: 1. Percocet 10/325 one to two tablets p.o. q.4-6 hours p.r.n. for     pain. 2. Robaxin 500 mg 1 tablet p.o. q.6 hours p.r.n. for spasms. 3. Lovenox 30 mg 1 subcu injection q.12 hours x3-4 weeks postop for     DVT prophylaxis. 4. Colace 100 mg 1 tablet p.o. b.i.d. 5. Klonopin 1 mg tablet, 1 tablet p.o. b.i.d. p.r.n. anxiety. 6. Hydrochlorothiazide 25 mg 1 tablet p.o. daily. 7. Claritin 10 mg 1 tablet p.o. daily. 8. Cozaar 100 mg 1 tablet p.o. daily. 9. Effexor  XR 75 mg 1 tablet p.o. daily.  DISCHARGE INSTRUCTIONS:  While at the Skilled Nursing Facility, the patient will continue working with PT and OT to improve ambulation and knee range of motion and strengthening.  She is weightbearing as tolerated with walker and then can wean to single prong cane as tolerated.  Okay to shower, but no tub soaking.  Do not apply any creams or ointments to her incision.  Daily dressing changes with 4 x 4 gauze and apply TED hose over this.  Lovenox 30 mg 1 subcu injection q.12 hours x3-4 weeks postop for DVT prophylaxis.  Must use CPM 0 to 70 degrees 6 to 8 hours daily and increase by 10 degrees daily as tolerated.  Can wean out of knee immobilizer as quad  strength improves. Needs a return office visit with Dr. Eulah Pont when she is 2 weeks postop for recheck, and we will remove knee staples at that time.  Call our office immediately if there are any questions or concerns before scheduled appointment.     Genene Churn. Denton Meek.   ______________________________ Loreta Ave, M.D.    JMO/MEDQ  D:  05/20/2011  T:  05/20/2011  Job:  [id number]

## 2011-05-20 NOTE — Progress Notes (Signed)
Physical Therapy Treatment Note   05/20/11 0818  PT Visit Information  Last PT Received On 05/20/11  Precautions  Precautions Knee  Knee Immobilizer On when out of bed or walking  Restrictions  LLE Weight Bearing WBAT  Bed Mobility  Bed Mobility (assisted PA with dressing change)  Supine to Sit 3: Mod assist  Supine to Sit Details (indicate cue type and reason) assist for L LE, pt with c/o of 10/10 pain  Transfers  Sit to Stand 3: Mod assist  Sit to Stand Details (indicate cue type and reason) v/c's for hand placement, patient required increased underarm assist this date due to increased pain in L knee  Ambulation/Gait  Ambulation/Gait Assistance 4: Min assist  Ambulation/Gait Assistance Details (indicate cue type and reason) v/c's for sequencing initially. max encouragement to amb to door, freq rest breaks due to pain  Ambulation Distance (Feet) 12 Feet  Assistive device Rolling walker  Gait Pattern Step-to pattern;Decreased step length - left;Decreased stance time - left  Gait velocity decreased  Stairs No  Total Joint Exercises  Long Arc Quad AROM;Left;10 reps;Seated (achieved approx 1/3 of ROM)  PT - End of Session  Equipment Utilized During Treatment Gait belt;Left knee immobilizer  Activity Tolerance Patient limited by pain  Patient left in chair;with call bell in reach  Nurse Communication Mobility status for transfers;Mobility status for ambulation  General  Behavior During Session [facility] for tasks performed  Cognition [facility] for tasks performed  PT - Assessment/Plan  Comments on Treatment Session patient con't to have decreased active L knee ROM and strength due to increased pain. Patient to con't to benefit from SNF upon d/c due to patient living alone and was I PTA.  PT Plan Discharge plan remains appropriate;Frequency remains appropriate  PT Frequency 7X/week  Follow Up Recommendations Skilled nursing facility;Supervision/Assistance - 24 hour  Equipment Recommended  Defer to next venue  Acute Rehab PT Goals  PT Goal: Supine/Side to Sit - Progress Progressing toward goal  PT Goal: Sit to Stand - Progress Progressing toward goal  PT Goal: Ambulate - Progress Progressing toward goal  PT Goal: Perform Home Exercise Program - Progress Progressing toward goal    Pain: 10/10 L Knee pain, RN provided pain medication during tx  Lewis Shock, PT, DPT Pager #: [phone] Office #: [phone]

## 2011-05-20 NOTE — Progress Notes (Signed)
Pt is s/p L TKR. Knee precautions. +cms. Pt is WBAT with RW. Pt is alert and oriented x 3. Neuro check is negative. Lungs CTA but noted to be diminished in the bases. Pt performs IS per order. Heart rate regular rate and rhythm but noted to be tachy in the low 100's at times. No s/sx resp or cardiac distress and no c/o such. KI when not in CPM per order. CPM per order. Pt repts LBM 4/2. Pt repts passing gas and denies nausea or vomiting. Pt tolerates diet without difficulty. Pt repts that she suffers from urinary freq and urgency. Pt states, "sometimes I go on myself if I can't get to bathroom quick". No pressure skin issues. Pt can turn self and floats heels. Zonia Kief PA for Dr. Eulah Pont in to see pt. PA d/ced pt's hemovac that was only draining small amount of serosanquinous drainage and changed the pt's dressing.

## 2011-05-20 NOTE — Progress Notes (Signed)
CSW just noted D/C summary done by MD.  [facility] SNF, unable to take pt this late in the day and over the weekend.  Plan to discharge Monday 4/8 to [facility].  CSW continues to follow to assist with d/c planning. [phone]

## 2011-05-21 LAB — BASIC METABOLIC PANEL
BUN: 17 mg/dL (ref 6–23)
Calcium: 9.8 mg/dL (ref 8.4–10.5)
Creatinine, Ser: 0.78 mg/dL (ref 0.50–1.10)
GFR calc Af Amer: >90 mL/min (ref >90)
GFR calc non Af Amer: >90 mL/min (ref >90)

## 2011-05-21 LAB — CBC
HCT: 25.2 % — ABNORMAL LOW (ref 36.0–46.0)
MCHC: 33.3 g/dL (ref 30.0–36.0)
MCV: 85.1 fL (ref 78.0–100.0)
Platelets: 228 10*3/uL (ref 150–400)
RDW: 14.2 % (ref 11.5–15.5)
WBC: 10.8 10*3/uL — ABNORMAL HIGH (ref 4.0–10.5)

## 2011-05-21 NOTE — Progress Notes (Signed)
Subjective: 3 Days Post-Op Procedure(s) (LRB): TOTAL KNEE ARTHROPLASTY (Left) Patient reports pain as moderate. Patient reports 1st BM just prior to rounds    Objective: Vital signs in last 24 hours: Temp:  [97.9 F (36.6 C)-100.8 F (38.2 C)] 100.4 F (38 C) (04/06 0707) Pulse Rate:  [64-116] 110  (04/06 0707) Resp:  [18-19] 18  (04/06 0707) BP: (129-134)/(64-76) 130/64 mmHg (04/06 0707) SpO2:  [95 %-98 %] 98 % (04/06 0707) Weight:  [92.851 kg (204 lb 11.2 oz)] 92.851 kg (204 lb 11.2 oz) (04/05 1649)  Intake/Output from previous day: 04/05 0701 - 04/06 0700 In: 240 [P.O.:240] Out: -  Intake/Output this shift:     Basename 05/21/11 0530 05/20/11 1447 05/20/11 0601 05/19/11 1258 05/19/11 0648  HGB 8.4* 8.7* 8.4* 8.7* 8.8*    Basename 05/21/11 0530 05/20/11 1447 05/20/11 0601  WBC 10.8* -- 10.6*  RBC 2.96* -- 2.94*  HCT 25.2* 25.3* --  PLT 228 -- 199    Basename 05/21/11 0530 05/20/11 0601  NA 135 133*  K 4.1 3.9  CL 100 98  CO2 28 26  BUN 17 19  CREATININE 0.78 0.81  GLUCOSE 134* 135*  CALCIUM 9.8 9.5    Basename 05/19/11 0648  LABPT --  INR 1.21    Neurologically intact ABD soft Neurovascular intact wound benign;no sign of infection  Assessment/Plan: 3 Days Post-Op Procedure(s) (LRB): TOTAL KNEE ARTHROPLASTY (Left) Discharge to SNF when bed available (monday per sw notation) Encourage Incentive spirometer, suspect atelectasis is contributing to low grad temps  Lucio Litsey B 05/21/2011, 10:42 AM

## 2011-05-22 LAB — CBC
MCHC: 33.2 g/dL (ref 30.0–36.0)
Platelets: 269 10*3/uL (ref 150–400)
RDW: 14.1 % (ref 11.5–15.5)
WBC: 10.6 10*3/uL — ABNORMAL HIGH (ref 4.0–10.5)

## 2011-05-22 NOTE — Progress Notes (Signed)
PT Progress Note:      05/22/11 1000  PT Visit Information  Last PT Received On 05/22/11  Precautions  Precautions Knee  Required Braces or Orthoses Yes  Knee Immobilizer On when out of bed or walking;  Ambulated without KI today due to pt able to perform SLR's independently.  No knee buckling noted.    Restrictions  LLE Weight Bearing WBAT  Bed Mobility  Supine to Sit 6: Modified independent (Device/Increase time)  Sit to Supine 6: Modified independent (Device/Increase time)  Transfers  Transfers Yes  Sit to Stand 5: Supervision;From bed;From chair/3-in-1;With upper extremity assist  Sit to Stand Details (indicate cue type and reason) cues for hand placement & safe technique  Stand to Sit 5: Supervision;With upper extremity assist;To chair/3-in-1;To bed;With armrests  Stand to Sit Details cues for hand placement & use of UE's to control descent.    Ambulation/Gait  Ambulation/Gait Yes  Ambulation/Gait Assistance (Min Guard (A))  Ambulation/Gait Assistance Details (indicate cue type and reason) cues for sequencing, RW advancement/management, increased upright posture;  Ambulated without KI today due to pt able to perform SLR's independently with no ext. Lag.  No knee buckling noted.    Ambulation Distance (Feet) 120 Feet  Assistive device Rolling walker  Gait Pattern Step-through pattern;Decreased hip/knee flexion - left;Decreased stance time - left  Stairs No  Wheelchair Mobility  Wheelchair Mobility No  Posture/Postural Control  Posture/Postural Control No significant limitations  Balance  Balance Assessed No  Exercises  Exercises Total Joint  Total Joint Exercises  Ankle Circles/Pumps AROM;10 reps;Both;Supine  Quad Sets AROM;Strengthening;10 reps;Both;Supine  Heel Slides AAROM;Strengthening;Left;10 reps;Supine  Straight Leg Raises AROM;Strengthening;10 reps;Left;Supine  PT - End of Session  Equipment Utilized During Treatment Gait belt  Activity Tolerance Patient  tolerated treatment well  Patient left in chair  Nurse Communication Mobility status for transfers;Mobility status for ambulation  General  Behavior During Session [facility] for tasks performed  Cognition [facility] for tasks performed  PT - Assessment/Plan  Comments on Treatment Session Pt pleasant & willing to participate in PT session.  Progressing with PT goals.  Increased ambulation distance & required decreased physical (A) from clinician.    PT Plan Discharge plan remains appropriate;Frequency remains appropriate  PT Frequency 7X/week  Follow Up Recommendations Skilled nursing facility  Equipment Recommended Defer to next venue  Acute Rehab PT Goals  PT Goal: Supine/Side to Sit - Progress Met  PT Goal: Sit to Stand - Progress Met  PT Goal: Ambulate - Progress Progressing toward goal  PT Goal: Perform Home Exercise Program - Progress Progressing toward goal       Verdell Face, [place] [phone] 05/22/2011

## 2011-05-22 NOTE — Progress Notes (Signed)
Subjective: 4 Days Post-Op Procedure(s) (LRB): TOTAL KNEE ARTHROPLASTY (Left) Patient reports pain as moderate.    Objective: Vital signs in last 24 hours: Temp:  [98 F (36.7 C)-99.6 F (37.6 C)] 98 F (36.7 C) (04/07 0701) Pulse Rate:  [104-110] 104  (04/07 0701) Resp:  [16-20] 20  (04/07 0701) BP: (122-148)/(58-62) 148/62 mmHg (04/07 0701) SpO2:  [99 %] 99 % (04/07 0701)  Intake/Output from previous day:   Intake/Output this shift:     Basename 05/21/11 0530 05/20/11 1447 05/20/11 0601 05/19/11 1258  HGB 8.4* 8.7* 8.4* 8.7*    Basename 05/21/11 0530 05/20/11 1447 05/20/11 0601  WBC 10.8* -- 10.6*  RBC 2.96* -- 2.94*  HCT 25.2* 25.3* --  PLT 228 -- 199    Basename 05/21/11 0530 05/20/11 0601  NA 135 133*  K 4.1 3.9  CL 100 98  CO2 28 26  BUN 17 19  CREATININE 0.78 0.81  GLUCOSE 134* 135*  CALCIUM 9.8 9.5   No results found for this basename: LABPT:2,INR:2 in the last 72 hours  Neurologically intact ABD soft Intact pulses distally Dorsiflexion/Plantar flexion intact wound clean and dry, no sign of infection; dressing changed by RN  Assessment/Plan: 4 Days Post-Op Procedure(s) (LRB): TOTAL KNEE ARTHROPLASTY (Left) Plan for discharge tomorrow Will recheck cbc today due to continued elevated heartrate and previous low hgb  [name] 05/22/2011, 8:04 AM

## 2011-05-23 LAB — CBC
Hemoglobin: 8.4 g/dL — ABNORMAL LOW (ref 12.0–15.0)
MCH: 29 pg (ref 26.0–34.0)
Platelets: 279 10*3/uL (ref 150–400)
RBC: 2.9 MIL/uL — ABNORMAL LOW (ref 3.87–5.11)
WBC: 8 10*3/uL (ref 4.0–10.5)

## 2011-05-23 NOTE — Progress Notes (Signed)
Subjective: Doing well.  Pain controlled.  Ready for transfer today,  Objective: Vital signs in last 24 hours: Temp:  [98.5 F (36.9 C)-98.7 F (37.1 C)] 98.6 F (37 C) (04/08 0604) Pulse Rate:  [88-109] 109  (04/08 0604) Resp:  [20] 20  (04/08 0604) BP: (118-154)/(53-83) 141/83 mmHg (04/08 0604) SpO2:  [95 %-99 %] 95 % (04/08 0604)  Intake/Output from previous day: 04/07 0701 - 04/08 0700 In: 1080 [P.O.:1080] Out: -  Intake/Output this shift:     Basename 05/22/11 0849 05/21/11 0530 05/20/11 1447  HGB 8.4* 8.4* 8.7*    Basename 05/22/11 0849 05/21/11 0530  WBC 10.6* 10.8*  RBC 2.95* 2.96*  HCT 25.3* 25.2*  PLT 269 228    Basename 05/21/11 0530  NA 135  K 4.1  CL 100  CO2 28  BUN 17  CREATININE 0.78  GLUCOSE 134*  CALCIUM 9.8   No results found for this basename: LABPT:2,INR:2 in the last 72 hours  Wound looks good, staples intact   No signs of infection.  Calf nt, nvi.    Assessment/Plan: Transfer to snf today.  F/u in office as scheduled.  Recheck Hgb.     Jarrod Mcenery M 05/23/2011, 7:18 AM

## 2011-05-23 NOTE — Progress Notes (Signed)
Pt is ready for discharge to [facility]. Facility is ready to accept pt. Pt will completing paperwork at facility. Pt's insurance will cover non-emergent ambulance. This was clarified by [facility] representative, Zelda. [facility] will provide transportation. [facility] is agreeable to discharge plan. CSW is signing off as pt is discharging.   Dede Query, MSW, Theresia Majors [phone]

## 2011-05-23 NOTE — Progress Notes (Signed)
PT Progress Note:      05/23/11 1100  PT Visit Information  Last PT Received On 05/23/11  Precautions  Precautions Knee  Required Braces or Orthoses Yes  Knee Immobilizer On when out of bed or walking (ambulated without KI due to performing SLR's independently)  Restrictions  Weight Bearing Restrictions No  LLE Weight Bearing WBAT  Bed Mobility  Bed Mobility Yes  Supine to Sit 6: Modified independent (Device/Increase time)  Sitting - Scoot to Edge of Bed 6: Modified independent (Device/Increase time)  Sit to Supine 6: Modified independent (Device/Increase time)  Transfers  Transfers Yes  Sit to Stand From bed;6: Modified independent (Device/Increase time)  Stand to Sit 5: Supervision;To bed;With upper extremity assist  Stand to Sit Details cues for use of UE's to control descent.    Ambulation/Gait  Ambulation/Gait Yes  Ambulation/Gait Assistance 5: Supervision  Ambulation/Gait Assistance Details (indicate cue type and reason) Cues for safe use of RW & technique  Ambulation Distance (Feet) 200 Feet  Assistive device Rolling walker  Gait Pattern Step-through pattern;Decreased hip/knee flexion - left;Decreased stance time - left  Stairs No  Wheelchair Mobility  Wheelchair Mobility No  Posture/Postural Control  Posture/Postural Control No significant limitations  Balance  Balance Assessed No  Exercises  Exercises Total Joint  Total Joint Exercises  Ankle Circles/Pumps AROM;Both;15 reps;Supine  Quad Sets AROM;Strengthening;Both;15 reps;Supine  Knee Flexion AAROM;Left;10 reps;Seated  Long [employer] AROM;Strengthening;Left;15 reps;Seated  Straight Leg Raises AROM;Strengthening;Left;15 reps;Supine  Hip ABduction/ADduction AROM;Strengthening;Left;15 reps;Supine  PT - End of Session  Equipment Utilized During Treatment Gait belt  Activity Tolerance Patient tolerated treatment well  Patient left in bed;with call bell in reach  General  Behavior During Session [facility] for tasks  performed  Cognition [facility] for tasks performed  PT - Assessment/Plan  PT Plan Discharge plan remains appropriate;Frequency remains appropriate  PT Frequency 7X/week  Follow Up Recommendations Skilled nursing facility  Equipment Recommended Defer to next venue  Acute Rehab PT Goals  PT Goal: Supine/Side to Sit - Progress Met  PT Goal: Sit to Stand - Progress Met  PT Goal: Ambulate - Progress Progressing toward goal  PT Goal: Perform Home Exercise Program - Progress Progressing toward goal       Verdell Face, [place] [phone] 05/23/2011

## 2011-10-11 IMAGING — CR DG CHEST 2V
2 series · 2 of 2 positions shown · non-contrast
Comparison: None.

CLINICAL DATA: Symptomatic gallstones. Pre-op respiratory exam.

CHEST - 2 VIEW

[w chest pa]
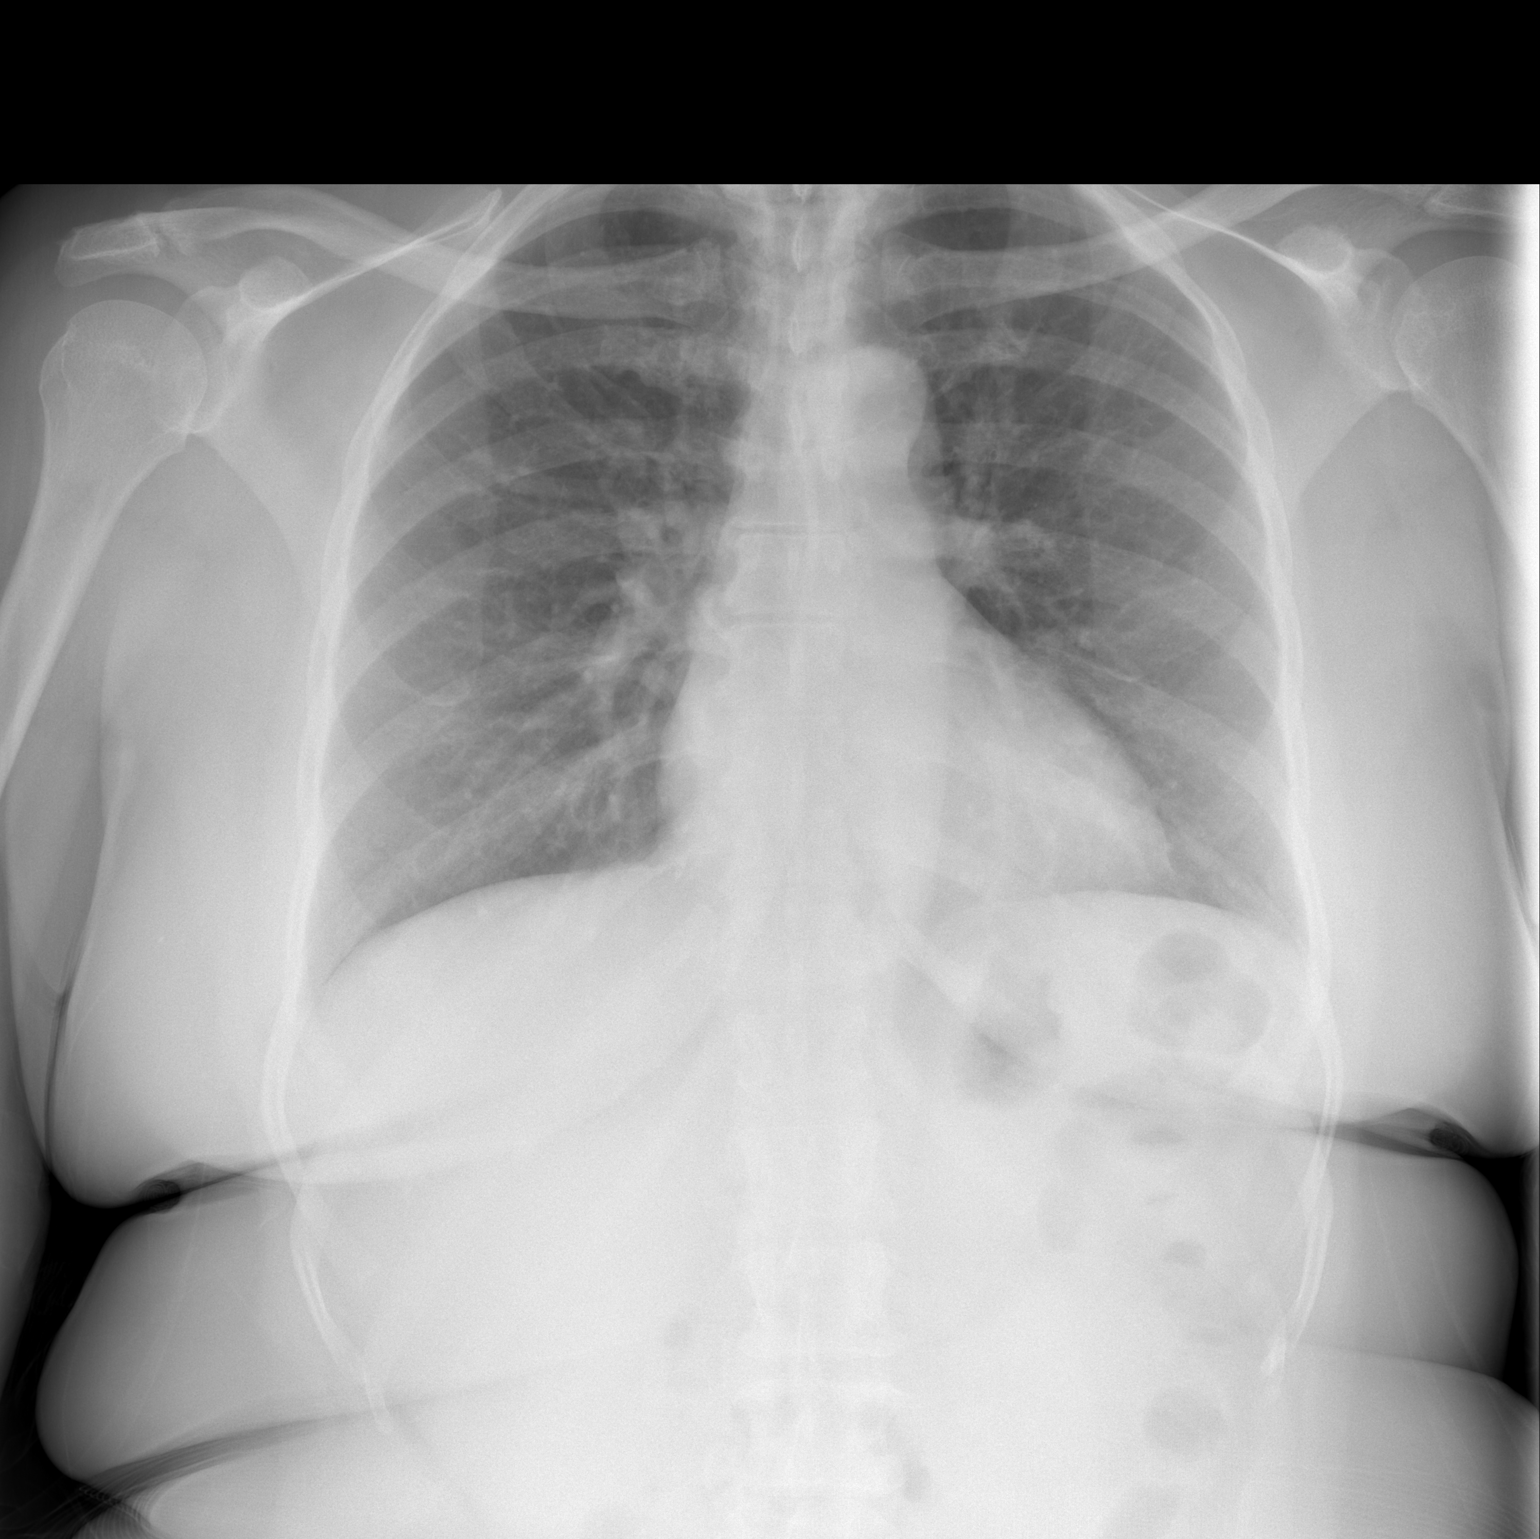

[w chest lat]
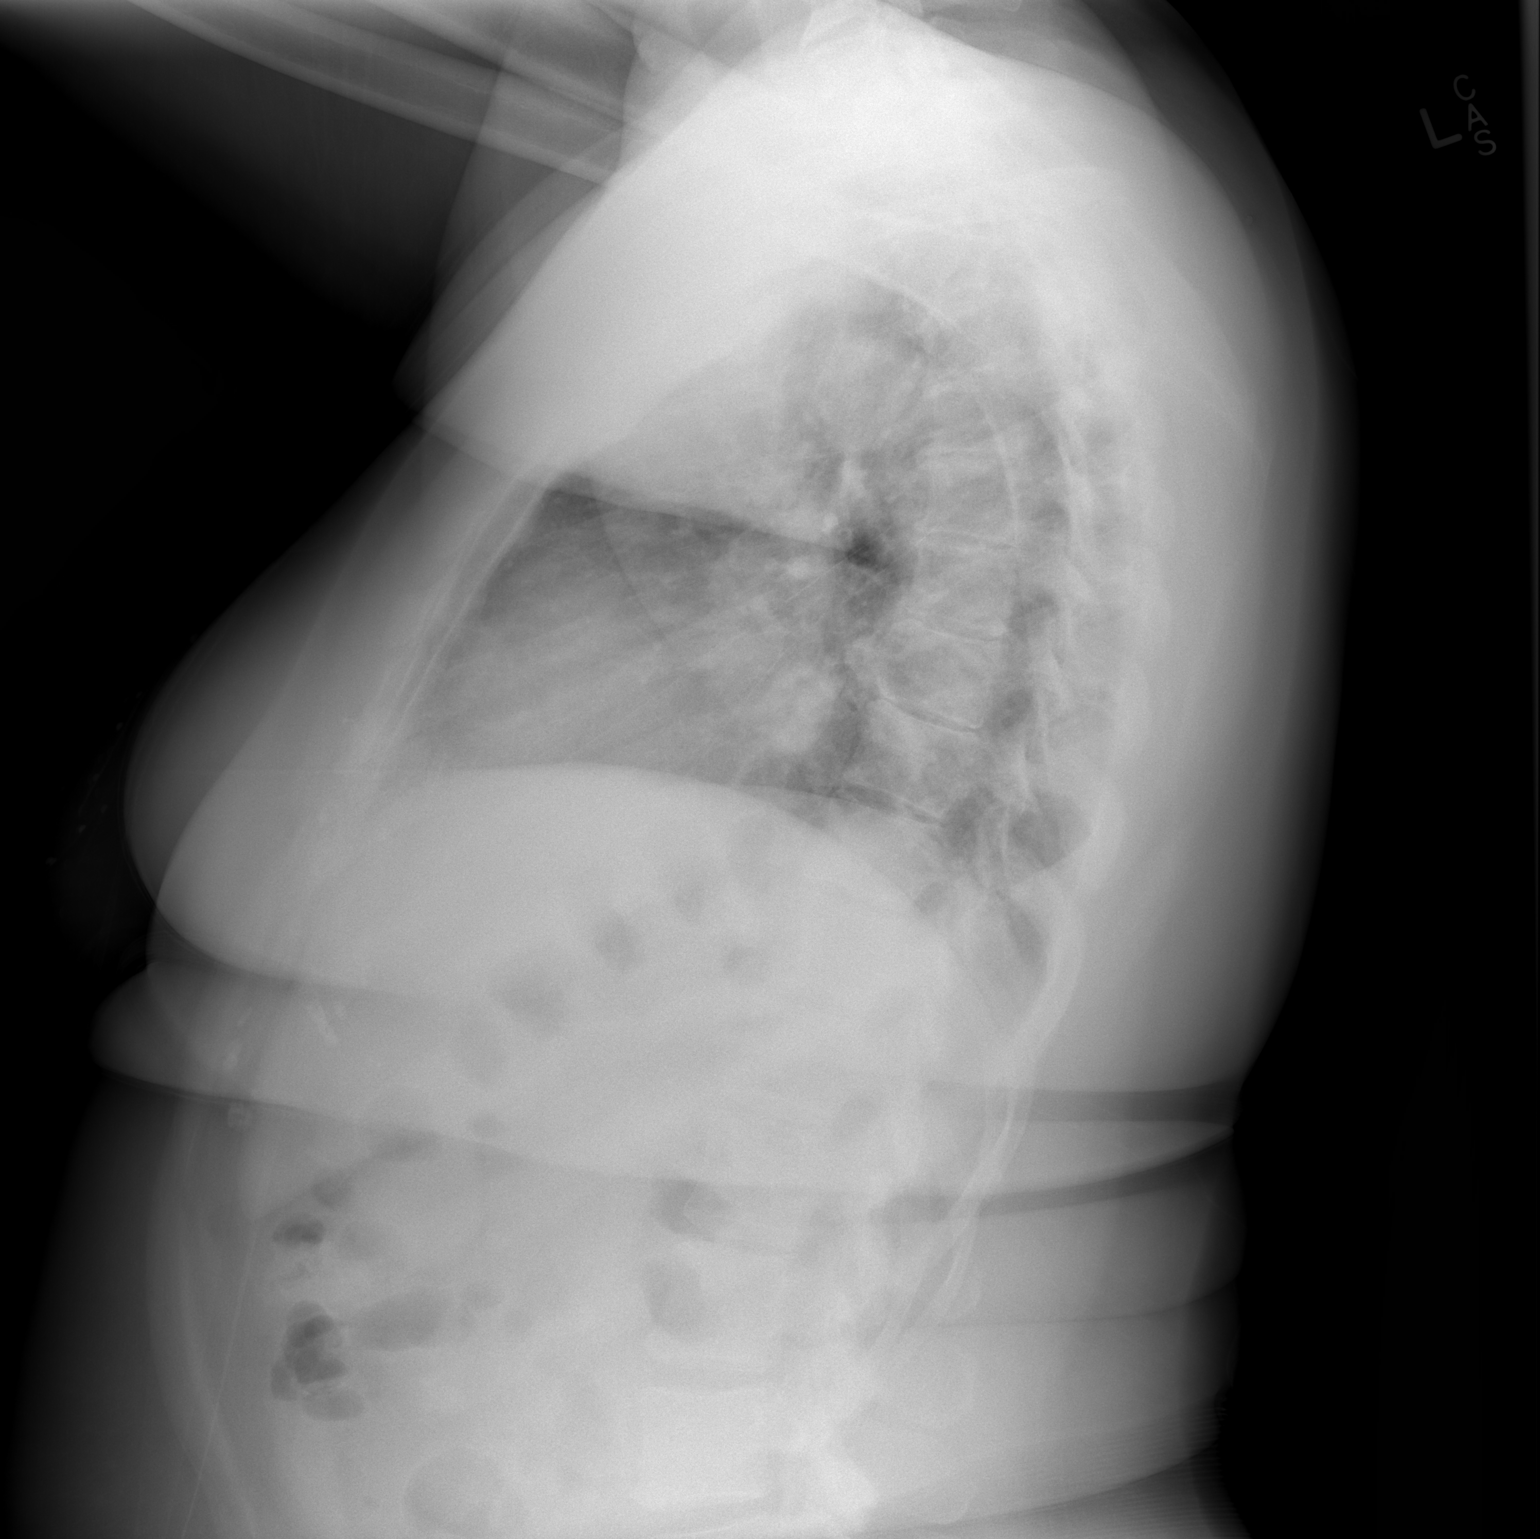

[2 of 2 positions shown; findings below may reference images not displayed]

FINDINGS: The heart size is normal.  Both lungs are clear.  No
evidence of pleural effusion.  No mass or adenopathy identified.
IMPRESSION: No active cardiopulmonary disease.

## 2012-04-15 ENCOUNTER — Ambulatory Visit (INDEPENDENT_AMBULATORY_CARE_PROVIDER_SITE_OTHER): Payer: 59 | Admitting: Emergency Medicine

## 2012-04-15 VITALS — BP 128/63 | HR 61 | Temp 98.0°F | Resp 16 | Ht 63.5 in | Wt 212.0 lb

## 2012-04-15 DIAGNOSIS — I10 Essential (primary) hypertension: Secondary | ICD-10-CM

## 2012-04-15 LAB — COMPREHENSIVE METABOLIC PANEL
AST: 23 U/L (ref 0–37)
BUN: 13 mg/dL (ref 6–23)
CO2: 32 mEq/L (ref 19–32)
Calcium: 10.4 mg/dL (ref 8.4–10.5)
Chloride: 101 mEq/L (ref 96–112)
Creat: 0.79 mg/dL (ref 0.50–1.10)
Total Bilirubin: 0.4 mg/dL (ref 0.3–1.2)

## 2012-04-15 LAB — POCT CBC
Lymph, poc: 2.9 (ref 0.6–3.4)
MCH, POC: 27.5 pg (ref 27–31.2)
MCHC: 30.9 g/dL — AB (ref 31.8–35.4)
MCV: 89.1 fL (ref 80–97)
MID (cbc): 0.4 (ref 0–0.9)
Platelet Count, POC: 306 10*3/uL (ref 142–424)
RBC: 3.67 M/uL — AB (ref 4.04–5.48)
WBC: 6.4 10*3/uL (ref 4.6–10.2)

## 2012-04-15 NOTE — Progress Notes (Signed)
[facility] [address] [place] [address] [phone]  Date:  04/15/2012   Name:  [name]   DOB:  27-Apr-1950   MRN:  [id number]  PCP:  No primary provider on file.    Chief Complaint: Back Pain   History of Present Illness:  [name] is a 62 y.o. 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  She was given fluids and some food and responded to that. She needs a note to clear her to return to work.  Under treatment for hypertension and chronic back pain.  No change in her pain.  Denies other complaint or health concern today.     There is no problem list on file for this patient.   Past Medical History  Diagnosis Date  . Asthma     ashmatic bronchitis hx  . Pneumonia     hx  . Anemia     hx  . Blood transfusion     gallbladder 12  . Fibromyalgia   . Depression   . Arthritis     working on L knee, but right knee also bad    Past Surgical History  Procedure Laterality Date  . Cholecystectomy    . Tonsillectomy    . Abdominal hysterectomy    . Knee arthroscopy  rt  . Knee arthroplasty  02    rt    . Total knee arthroplasty  05/18/2011    Procedure: TOTAL KNEE ARTHROPLASTY;  Surgeon: Loreta Ave, MD;  Location: [facility];  Service: Orthopedics;  Laterality: Left;  . Joint replacement      History  Substance Use Topics  . Smoking status: Never Smoker   . Smokeless tobacco: Not on file  . Alcohol Use: No    History reviewed. No pertinent family history.  Allergies  Allergen Reactions  . Pregabalin Nausea And Vomiting  . Shellfish Allergy Itching and Other (See Comments)    Severe hives    Medication list has been reviewed and updated.  Current Outpatient Prescriptions on File Prior to Visit  Medication Sig Dispense Refill  . clonazePAM (KLONOPIN) 1 MG tablet Take 1 mg by mouth 2  (two) times daily as needed. For anxiety      . hydrochlorothiazide (HYDRODIURIL) 25 MG tablet Take 25 mg by mouth daily.      Marland Kitchen losartan (COZAAR) 100 MG tablet Take 100 mg by mouth daily.      Marland Kitchen venlafaxine (EFFEXOR-XR) 75 MG 24 hr capsule Take 75 mg by mouth daily.      Marland Kitchen loratadine (CLARITIN) 10 MG tablet Take 10 mg by mouth daily.       No current facility-administered medications on file prior to visit.    Review of Systems:  As per HPI, otherwise negative.    Physical Examination: Filed Vitals:   04/15/12 1125  BP: 128/63  Pulse: 61  Temp: 98 F (36.7 C)  Resp: 16   Filed Vitals:   04/15/12 1125  Height: 5' 3.5" (1.613 m)  Weight: 212 lb (96.163 kg)   Body mass index is 36.96 kg/(m^2). Ideal Body Weight: Weight in (lb) to have BMI = 25: 143.1  GEN: WDWN, NAD, Non-toxic, A & O x 3 HEENT: Atraumatic, Normocephalic. Neck supple. No masses, No LAD. Ears and Nose: No external deformity. CV: RRR,  No M/G/R. No JVD. No thrill. No extra heart sounds. PULM: CTA B, no wheezes, crackles, rhonchi. No retractions. No resp. distress. No accessory muscle use. ABD: S, NT, ND, +BS. No rebound. No HSM. EXTR: No c/c/e NEURO Normal gait.  PSYCH: Normally interactive. Conversant. Not depressed or anxious appearing.  Calm demeanor.    Assessment and Plan: Hypertension Follow up with [facility]MD this week  Carmelina Dane, MD

## 2012-04-15 NOTE — Patient Instructions (Addendum)

## 2012-04-16 MED ORDER — POTASSIUM CHLORIDE ER 8 MEQ PO TBCR: 8.0000 meq | EXTENDED_RELEASE_TABLET | Freq: Two times a day (BID) | ORAL | Status: DC

## 2012-04-17 NOTE — Progress Notes (Signed)
Reviewed and agree.

## 2012-04-27 ENCOUNTER — Other Ambulatory Visit: Payer: Self-pay | Admitting: Internal Medicine

## 2012-04-27 DIAGNOSIS — R410 Disorientation, unspecified: Secondary | ICD-10-CM

## 2012-04-30 ENCOUNTER — Ambulatory Visit
Admission: RE | Admit: 2012-04-30 | Discharge: 2012-04-30 | Disposition: A | Discharge status: Home / Self Care | Payer: 59 | Source: Ambulatory Visit | Attending: Internal Medicine | Admitting: Internal Medicine

## 2012-04-30 DIAGNOSIS — R410 Disorientation, unspecified: Secondary | ICD-10-CM

## 2013-11-07 ENCOUNTER — Other Ambulatory Visit: Payer: Self-pay | Admitting: Obstetrics and Gynecology

## 2013-11-08 LAB — CYTOLOGY - PAP

## 2014-11-12 ENCOUNTER — Other Ambulatory Visit: Payer: Self-pay | Admitting: Physician Assistant

## 2014-11-12 NOTE — H&P (Signed)
  Odaly comes in for follow up.  Unrelenting worsening symptoms, right hip.  Marked groin pain.  Upsetting symptoms in her back.  Pain now radiating down to her knee.  Everything we have done in the past is no longer effective.  Greater trochanteric injection of the left hip helped to an extent there.  The right hip however has unrelenting intolerable symptoms.  Intraarticular injection done previously really did not help to much of an extent.  She has gotten to the point she is using a cane all the time.  She would like to pursue definitive treatment.   History and general exam is reviewed.  Of note, she is still at 230 pounds.  Ideally I would like to get her down to 200 pounds with a BMI of 35 before definitive treatment.    EXAMINATION: Gait is markedly antalgic with a Trendelenburg component on the right.  Virtually no rotation of her right hip.  Negative straight leg raise.    X-RAYS: Follow up x-rays show bone on bone right hip.  No cartilage space left at all.  Some changes on the left, not marked.    DISPOSITION:  The only viable option she has is right total hip replacement.  Direct anterior approach.  She has to get her weight down to 200 pounds before we can proceed.  Procedure, risks, benefits and complications reviewed.  Paperwork complete.  All questions answered.  We will see her back prior to operative intervention once she achieves the ideal weight.  I went over this with her at great length, spending more than 15 minutes face-to-face.  She understands and agrees.    Daniel F. Murphy, M.D.   

## 2014-11-20 NOTE — Pre-Procedure Instructions (Signed)
[name]  11/20/2014     Your procedure is scheduled on : Wednesday December 03, 2014 at 10:50 AM.  Report to [facility] Admitting at 8:50 A.M.  Call this number if you have problems the morning of surgery: [phone]    Remember:  Do not eat food or drink liquids after midnight.  Take these medicines the morning of surgery with A SIP OF WATER : Alprazolam (Xanax) if needed, Cetirizine (Zyrtec), Clonazepam (Klonopin) if needed, Hydrocodone if needed, Tramadol (Ultram) if needed, Venlafaxine (Effexor XR)   Please stop taking any vitamins, herbal medications, Ibuprofen, Advil, Motrin, Aleve etc on Wednesday October 12th   Do not wear jewelry, make-up or nail polish.  Do not wear lotions, powders, or perfumes.    Do not shave 48 hours prior to surgery.    Do not bring valuables to the hospital.  [facility] is not responsible for any belongings or valuables.  Contacts, dentures or bridgework may not be worn into surgery.  Leave your suitcase in the car.  After surgery it may be brought to your room.  For patients admitted to the hospital, discharge time will be determined by your treatment team.  Patients discharged the day of surgery will not be allowed to drive home.   Name and phone number of your driver:    Special instructions:  Shower using CHG soap the night before and the morning of your surgery  Please read over the following fact sheets that you were given. Pain Booklet, Coughing and Deep Breathing, Blood Transfusion Information, Total Joint Packet, MRSA Information and Surgical Site Infection Prevention

## 2014-11-21 ENCOUNTER — Encounter (HOSPITAL_COMMUNITY)
Admission: RE | Admit: 2014-11-21 | Discharge: 2014-11-21 | Disposition: A | Discharge status: Home / Self Care | Payer: 59 | Source: Ambulatory Visit | Attending: Orthopedic Surgery | Admitting: Orthopedic Surgery

## 2014-11-21 ENCOUNTER — Encounter (HOSPITAL_COMMUNITY): Payer: Self-pay

## 2014-11-21 DIAGNOSIS — Z0183 Encounter for blood typing: Secondary | ICD-10-CM | POA: Insufficient documentation

## 2014-11-21 DIAGNOSIS — Z01812 Encounter for preprocedural laboratory examination: Secondary | ICD-10-CM | POA: Diagnosis not present

## 2014-11-21 DIAGNOSIS — D649 Anemia, unspecified: Secondary | ICD-10-CM | POA: Diagnosis not present

## 2014-11-21 DIAGNOSIS — J45909 Unspecified asthma, uncomplicated: Secondary | ICD-10-CM | POA: Diagnosis not present

## 2014-11-21 DIAGNOSIS — M1611 Unilateral primary osteoarthritis, right hip: Secondary | ICD-10-CM | POA: Insufficient documentation

## 2014-11-21 DIAGNOSIS — Z79899 Other long term (current) drug therapy: Secondary | ICD-10-CM | POA: Insufficient documentation

## 2014-11-21 DIAGNOSIS — I10 Essential (primary) hypertension: Secondary | ICD-10-CM | POA: Diagnosis not present

## 2014-11-21 DIAGNOSIS — K219 Gastro-esophageal reflux disease without esophagitis: Secondary | ICD-10-CM | POA: Insufficient documentation

## 2014-11-21 DIAGNOSIS — Z01818 Encounter for other preprocedural examination: Secondary | ICD-10-CM | POA: Insufficient documentation

## 2014-11-21 HISTORY — DX: Diverticulitis of intestine, part unspecified, without perforation or abscess without bleeding: K57.92

## 2014-11-21 HISTORY — DX: Anxiety disorder, unspecified: F41.9

## 2014-11-21 HISTORY — DX: Gastro-esophageal reflux disease without esophagitis: K21.9

## 2014-11-21 HISTORY — DX: Essential (primary) hypertension: I10

## 2014-11-21 LAB — CBC WITH DIFFERENTIAL/PLATELET
Basophils Absolute: 0 10*3/uL (ref 0.0–0.1)
Basophils Relative: 1 %
EOS ABS: 0.2 10*3/uL (ref 0.0–0.7)
EOS PCT: 3 %
HCT: 29.5 % — ABNORMAL LOW (ref 36.0–46.0)
HEMOGLOBIN: 9.4 g/dL — AB (ref 12.0–15.0)
LYMPHS ABS: 2 10*3/uL (ref 0.7–4.0)
Lymphocytes Relative: 33 %
MCH: 26.3 pg (ref 26.0–34.0)
MCHC: 31.9 g/dL (ref 30.0–36.0)
MCV: 82.6 fL (ref 78.0–100.0)
MONOS PCT: 7 %
Monocytes Absolute: 0.4 10*3/uL (ref 0.1–1.0)
Neutro Abs: 3.3 10*3/uL (ref 1.7–7.7)
Neutrophils Relative %: 56 %
Platelets: 343 10*3/uL (ref 150–400)
RBC: 3.57 MIL/uL — ABNORMAL LOW (ref 3.87–5.11)
RDW: 16.1 % — ABNORMAL HIGH (ref 11.5–15.5)
WBC: 5.9 10*3/uL (ref 4.0–10.5)

## 2014-11-21 LAB — COMPREHENSIVE METABOLIC PANEL
ALK PHOS: 89 U/L (ref 38–126)
ALT: 40 U/L (ref 14–54)
AST: 37 U/L (ref 15–41)
Albumin: 4 g/dL (ref 3.5–5.0)
Anion gap: 8 (ref 5–15)
BUN: 11 mg/dL (ref 6–20)
CALCIUM: 10.5 mg/dL — AB (ref 8.9–10.3)
CO2: 25 mmol/L (ref 22–32)
Chloride: 107 mmol/L (ref 101–111)
Creatinine, Ser: 0.72 mg/dL (ref 0.44–1.00)
GFR calc Af Amer: >60 mL/min (ref >60)
GFR calc non Af Amer: >60 mL/min (ref >60)
Glucose, Bld: 110 mg/dL — ABNORMAL HIGH (ref 65–99)
Potassium: 3.3 mmol/L — ABNORMAL LOW (ref 3.5–5.1)
SODIUM: 140 mmol/L (ref 135–145)
TOTAL PROTEIN: 6.6 g/dL (ref 6.5–8.1)
Total Bilirubin: 0.3 mg/dL (ref 0.3–1.2)

## 2014-11-21 LAB — PROTIME-INR
INR: 1.14 (ref 0.00–1.49)
PROTHROMBIN TIME: 14.8 s (ref 11.6–15.2)

## 2014-11-21 LAB — SURGICAL PCR SCREEN
MRSA, PCR: NEGATIVE
STAPHYLOCOCCUS AUREUS: NEGATIVE

## 2014-11-21 LAB — APTT: aPTT: 29 seconds (ref 24–37)

## 2014-11-21 NOTE — Progress Notes (Signed)
Patient arrived to PAT via wheelchair with her God-daughter. Patient called and stated she was running late as she stated someone from [facility] called her and told her to arrive at 0730. Nurse explained to patient that PAT does not open until 0800, and that her actual appointment was not scheduled until 0900, however Nurse saw patient at 0830.  PCP is Pearson Grippe. Patient denied having any acute chest pain/discomfort, or shortness of breath.

## 2014-11-21 NOTE — Progress Notes (Signed)
   11/21/14 0855  OBSTRUCTIVE SLEEP APNEA  Have you ever been diagnosed with sleep apnea through a sleep study? No  Do you snore loudly (loud enough to be heard through closed doors)?  1  Do you often feel tired, fatigued, or sleepy during the daytime (such as falling asleep during driving or talking to someone)? 1  Has anyone observed you stop breathing during your sleep? 0  Do you have, or are you being treated for high blood pressure? 1  BMI more than 35 kg/m2? 1  Age > 50 (1-yes) 1  Neck circumference greater than:Female 16 inches or larger, Female 17inches or larger? 1  Female Gender (Yes=1) 0  Obstructive Sleep Apnea Score 6  Score 5 or greater  Results sent to PCP   This patient has screened at risk for sleep apnea using the STOP Bang tool used during a pre-surgical visit. A score of 5 or greater is at risk for sleep apnea.

## 2014-11-23 LAB — URINE CULTURE

## 2014-11-24 NOTE — Progress Notes (Signed)
Anesthesia Chart Review:  Pt is 64 year old female scheduled for R total hip arthroplasty anterior approach on 12/03/2014 with Dr. Jamison Neighbor.   PCP is Dr. Pearson Grippe.   PMH includes: HTN, asthma, anemia, GERD. Never smoker. BMI 36.   Medications include: irbesartan, potassium  Preoperative labs reviewed.  H/H 9.4/29.5. Notified Sherri in Dr. Greig Right office.   EKG 11/21/2014: NSR.   Pt is cleared from medical and cardiac standpoint for surgery by Dr. Selena Batten, who notes pt has anemia, hgb was 8.5 on 9/15, and pt may require transfusion.   If no changes, I anticipate pt can proceed with surgery as scheduled.   Rica Mast, FNP-[facility]/Anesthesiology Phone: [phone] 11/24/2014 4:40 PM

## 2014-12-02 MED ORDER — TRANEXAMIC ACID 1000 MG/10ML IV SOLN
1000.0000 mg | INTRAVENOUS | Status: AC
  Filled 2014-12-02: qty 10

## 2014-12-03 ENCOUNTER — Inpatient Hospital Stay (HOSPITAL_COMMUNITY): Payer: 59 | Admitting: Emergency Medicine

## 2014-12-03 ENCOUNTER — Encounter (HOSPITAL_COMMUNITY)
Admission: RE | Disposition: A | Discharge status: Skilled Nursing Facility | Payer: Self-pay | Source: Ambulatory Visit | Attending: Orthopedic Surgery

## 2014-12-03 ENCOUNTER — Encounter (HOSPITAL_COMMUNITY): Payer: Self-pay | Admitting: General Practice

## 2014-12-03 ENCOUNTER — Inpatient Hospital Stay (HOSPITAL_COMMUNITY)
Admission: RE | Admit: 2014-12-03 | Discharge: 2014-12-08 | DRG: 470 | Disposition: A | Discharge status: Skilled Nursing Facility | Payer: 59 | Source: Ambulatory Visit | Attending: Orthopedic Surgery | Admitting: Orthopedic Surgery

## 2014-12-03 ENCOUNTER — Inpatient Hospital Stay (HOSPITAL_COMMUNITY): Payer: 59 | Admitting: Certified Registered Nurse Anesthetist

## 2014-12-03 ENCOUNTER — Inpatient Hospital Stay (HOSPITAL_COMMUNITY): Payer: 59

## 2014-12-03 DIAGNOSIS — K219 Gastro-esophageal reflux disease without esophagitis: Secondary | ICD-10-CM | POA: Diagnosis present

## 2014-12-03 DIAGNOSIS — Z96649 Presence of unspecified artificial hip joint: Secondary | ICD-10-CM

## 2014-12-03 DIAGNOSIS — E876 Hypokalemia: Secondary | ICD-10-CM | POA: Diagnosis not present

## 2014-12-03 DIAGNOSIS — D62 Acute posthemorrhagic anemia: Secondary | ICD-10-CM | POA: Diagnosis not present

## 2014-12-03 DIAGNOSIS — M659 Synovitis and tenosynovitis, unspecified: Secondary | ICD-10-CM | POA: Diagnosis present

## 2014-12-03 DIAGNOSIS — M797 Fibromyalgia: Secondary | ICD-10-CM | POA: Diagnosis present

## 2014-12-03 DIAGNOSIS — Z79899 Other long term (current) drug therapy: Secondary | ICD-10-CM | POA: Diagnosis not present

## 2014-12-03 DIAGNOSIS — F329 Major depressive disorder, single episode, unspecified: Secondary | ICD-10-CM | POA: Diagnosis present

## 2014-12-03 DIAGNOSIS — Z6836 Body mass index (BMI) 36.0-36.9, adult: Secondary | ICD-10-CM | POA: Diagnosis not present

## 2014-12-03 DIAGNOSIS — I10 Essential (primary) hypertension: Secondary | ICD-10-CM | POA: Diagnosis present

## 2014-12-03 DIAGNOSIS — J309 Allergic rhinitis, unspecified: Secondary | ICD-10-CM | POA: Diagnosis not present

## 2014-12-03 DIAGNOSIS — M1611 Unilateral primary osteoarthritis, right hip: Principal | ICD-10-CM | POA: Diagnosis present

## 2014-12-03 DIAGNOSIS — M25551 Pain in right hip: Secondary | ICD-10-CM | POA: Diagnosis present

## 2014-12-03 DIAGNOSIS — M1610 Unilateral primary osteoarthritis, unspecified hip: Secondary | ICD-10-CM | POA: Diagnosis present

## 2014-12-03 DIAGNOSIS — F419 Anxiety disorder, unspecified: Secondary | ICD-10-CM | POA: Diagnosis present

## 2014-12-03 DIAGNOSIS — J45909 Unspecified asthma, uncomplicated: Secondary | ICD-10-CM | POA: Diagnosis present

## 2014-12-03 HISTORY — PX: TOTAL HIP ARTHROPLASTY: SHX124

## 2014-12-03 LAB — POCT I-STAT 4, (NA,K, GLUC, HGB,HCT)
GLUCOSE: 144 mg/dL — AB (ref 65–99)
HEMATOCRIT: 23 % — AB (ref 36.0–46.0)
HEMOGLOBIN: 7.8 g/dL — AB (ref 12.0–15.0)
POTASSIUM: 3.3 mmol/L — AB (ref 3.5–5.1)
SODIUM: 141 mmol/L (ref 135–145)

## 2014-12-03 SURGERY — ARTHROPLASTY, HIP, TOTAL, ANTERIOR APPROACH
Anesthesia: General | Site: Hip | Laterality: Right

## 2014-12-03 MED ORDER — METOCLOPRAMIDE HCL 5 MG PO TABS: 5.0000 mg | ORAL_TABLET | Freq: Three times a day (TID) | ORAL | Status: DC | PRN

## 2014-12-03 MED ORDER — HYDROMORPHONE HCL 1 MG/ML IJ SOLN
INTRAMUSCULAR | Status: AC
  Filled 2014-12-03: qty 1

## 2014-12-03 MED ORDER — DEXAMETHASONE SODIUM PHOSPHATE 4 MG/ML IJ SOLN
INTRAMUSCULAR | Status: DC | PRN
  Administered 2014-12-03: 4 mg via INTRAVENOUS

## 2014-12-03 MED ORDER — DIPHENHYDRAMINE HCL 12.5 MG/5ML PO ELIX
12.5000 mg | ORAL_SOLUTION | ORAL | Status: DC | PRN
  Administered 2014-12-05: 12.5 mg via ORAL
  Administered 2014-12-06: 25 mg via ORAL
  Filled 2014-12-03 (×3): qty 10

## 2014-12-03 MED ORDER — FENTANYL CITRATE (PF) 250 MCG/5ML IJ SOLN
INTRAMUSCULAR | Status: AC
  Filled 2014-12-03: qty 5

## 2014-12-03 MED ORDER — METOCLOPRAMIDE HCL 5 MG/ML IJ SOLN: 5.0000 mg | Freq: Three times a day (TID) | INTRAMUSCULAR | Status: DC | PRN

## 2014-12-03 MED ORDER — OXYCODONE HCL 5 MG PO TABS
5.0000 mg | ORAL_TABLET | ORAL | Status: DC | PRN
  Administered 2014-12-03 – 2014-12-07 (×14): 10 mg via ORAL
  Filled 2014-12-03 (×14): qty 2

## 2014-12-03 MED ORDER — PROPOFOL 10 MG/ML IV BOLUS
INTRAVENOUS | Status: DC | PRN
  Administered 2014-12-03: 170 mg via INTRAVENOUS

## 2014-12-03 MED ORDER — IRBESARTAN 300 MG PO TABS
300.0000 mg | ORAL_TABLET | Freq: Every day | ORAL | Status: DC
  Administered 2014-12-04 – 2014-12-07 (×4): 300 mg via ORAL
  Filled 2014-12-03 (×4): qty 1

## 2014-12-03 MED ORDER — ALBUMIN HUMAN 5 % IV SOLN
INTRAVENOUS | Status: DC | PRN
  Administered 2014-12-03 (×2): via INTRAVENOUS

## 2014-12-03 MED ORDER — SENNOSIDES-DOCUSATE SODIUM 8.6-50 MG PO TABS: 1.0000 | ORAL_TABLET | Freq: Every evening | ORAL | Status: DC | PRN

## 2014-12-03 MED ORDER — BUPIVACAINE LIPOSOME 1.3 % IJ SUSP
INTRAMUSCULAR | Status: DC | PRN
  Administered 2014-12-03: 20 mL

## 2014-12-03 MED ORDER — HYDROMORPHONE HCL 1 MG/ML IJ SOLN
0.2500 mg | INTRAMUSCULAR | Status: DC | PRN
  Administered 2014-12-03 (×3): 0.5 mg via INTRAVENOUS

## 2014-12-03 MED ORDER — APIXABAN 2.5 MG PO TABS: ORAL_TABLET | ORAL | Status: DC

## 2014-12-03 MED ORDER — CEFAZOLIN SODIUM-DEXTROSE 2-3 GM-% IV SOLR
INTRAVENOUS | Status: AC
  Filled 2014-12-03: qty 50

## 2014-12-03 MED ORDER — SUCCINYLCHOLINE CHLORIDE 20 MG/ML IJ SOLN
INTRAMUSCULAR | Status: AC
  Filled 2014-12-03: qty 1

## 2014-12-03 MED ORDER — APIXABAN 2.5 MG PO TABS
2.5000 mg | ORAL_TABLET | Freq: Two times a day (BID) | ORAL | Status: DC
  Administered 2014-12-04 – 2014-12-07 (×8): 2.5 mg via ORAL
  Filled 2014-12-03 (×9): qty 1

## 2014-12-03 MED ORDER — BUPIVACAINE LIPOSOME 1.3 % IJ SUSP
20.0000 mL | INTRAMUSCULAR | Status: DC
  Filled 2014-12-03: qty 20

## 2014-12-03 MED ORDER — SENNA 8.6 MG PO TABS
1.0000 | ORAL_TABLET | Freq: Two times a day (BID) | ORAL | Status: DC
  Administered 2014-12-03 – 2014-12-07 (×9): 8.6 mg via ORAL
  Filled 2014-12-03 (×9): qty 1

## 2014-12-03 MED ORDER — MEPERIDINE HCL 25 MG/ML IJ SOLN: 6.2500 mg | INTRAMUSCULAR | Status: DC | PRN

## 2014-12-03 MED ORDER — METHOCARBAMOL 500 MG PO TABS
500.0000 mg | ORAL_TABLET | Freq: Four times a day (QID) | ORAL | Status: DC | PRN
Start: 2014-12-03 — End: 2014-12-08
  Administered 2014-12-03 – 2014-12-05 (×5): 500 mg via ORAL
  Filled 2014-12-03 (×5): qty 1

## 2014-12-03 MED ORDER — POTASSIUM CHLORIDE IN NACL 20-0.9 MEQ/L-% IV SOLN
INTRAVENOUS | Status: DC
  Administered 2014-12-03: 22:00:00 via INTRAVENOUS
  Filled 2014-12-03 (×3): qty 1000

## 2014-12-03 MED ORDER — CHLORHEXIDINE GLUCONATE 4 % EX LIQD: 60.0000 mL | Freq: Once | CUTANEOUS | Status: DC

## 2014-12-03 MED ORDER — ACETAMINOPHEN 650 MG RE SUPP: 650.0000 mg | Freq: Four times a day (QID) | RECTAL | Status: DC | PRN

## 2014-12-03 MED ORDER — HYDROMORPHONE HCL 1 MG/ML IJ SOLN
0.5000 mg | INTRAMUSCULAR | Status: DC | PRN
  Administered 2014-12-03 – 2014-12-06 (×5): 1 mg via INTRAVENOUS
  Filled 2014-12-03 (×6): qty 1

## 2014-12-03 MED ORDER — PHENYLEPHRINE HCL 10 MG/ML IJ SOLN
10.0000 mg | INTRAVENOUS | Status: DC | PRN
  Administered 2014-12-03: 25 ug/min via INTRAVENOUS

## 2014-12-03 MED ORDER — PROPOFOL 10 MG/ML IV BOLUS
INTRAVENOUS | Status: AC
  Filled 2014-12-03: qty 20

## 2014-12-03 MED ORDER — NEOSTIGMINE METHYLSULFATE 10 MG/10ML IV SOLN
INTRAVENOUS | Status: DC | PRN
  Administered 2014-12-03: 4 mg via INTRAVENOUS

## 2014-12-03 MED ORDER — ALPRAZOLAM 0.5 MG PO TABS
0.5000 mg | ORAL_TABLET | Freq: Three times a day (TID) | ORAL | Status: DC | PRN
  Administered 2014-12-04 – 2014-12-06 (×4): 0.5 mg via ORAL
  Filled 2014-12-03 (×4): qty 1

## 2014-12-03 MED ORDER — ONDANSETRON HCL 4 MG PO TABS: 4.0000 mg | ORAL_TABLET | Freq: Three times a day (TID) | ORAL | Status: DC | PRN

## 2014-12-03 MED ORDER — LIDOCAINE HCL (CARDIAC) 20 MG/ML IV SOLN
INTRAVENOUS | Status: DC | PRN
  Administered 2014-12-03: 100 mg via INTRAVENOUS

## 2014-12-03 MED ORDER — CEFAZOLIN SODIUM-DEXTROSE 2-3 GM-% IV SOLR
2.0000 g | INTRAVENOUS | Status: AC
  Administered 2014-12-03: 2 g via INTRAVENOUS

## 2014-12-03 MED ORDER — VENLAFAXINE HCL ER 75 MG PO CP24
75.0000 mg | ORAL_CAPSULE | Freq: Every day | ORAL | Status: DC
  Administered 2014-12-04 – 2014-12-07 (×4): 75 mg via ORAL
  Filled 2014-12-03 (×4): qty 1

## 2014-12-03 MED ORDER — MENTHOL 3 MG MT LOZG: 1.0000 | LOZENGE | OROMUCOSAL | Status: DC | PRN

## 2014-12-03 MED ORDER — SORBITOL 70 % SOLN: 30.0000 mL | Freq: Every day | Status: DC | PRN

## 2014-12-03 MED ORDER — TRANEXAMIC ACID 1000 MG/10ML IV SOLN
1000.0000 mg | INTRAVENOUS | Status: AC
  Administered 2014-12-03: 1000 mg via INTRAVENOUS
  Filled 2014-12-03: qty 10

## 2014-12-03 MED ORDER — CELECOXIB 200 MG PO CAPS
200.0000 mg | ORAL_CAPSULE | Freq: Two times a day (BID) | ORAL | Status: DC
  Administered 2014-12-03 – 2014-12-07 (×9): 200 mg via ORAL
  Filled 2014-12-03 (×9): qty 1

## 2014-12-03 MED ORDER — CEFAZOLIN SODIUM-DEXTROSE 2-3 GM-% IV SOLR
2.0000 g | Freq: Four times a day (QID) | INTRAVENOUS | Status: AC
  Administered 2014-12-03: 2 g via INTRAVENOUS
  Filled 2014-12-03 (×3): qty 50

## 2014-12-03 MED ORDER — MIDAZOLAM HCL 5 MG/5ML IJ SOLN
INTRAMUSCULAR | Status: DC | PRN
  Administered 2014-12-03: 2 mg via INTRAVENOUS

## 2014-12-03 MED ORDER — BUPIVACAINE HCL (PF) 0.5 % IJ SOLN
INTRAMUSCULAR | Status: AC
  Filled 2014-12-03: qty 30

## 2014-12-03 MED ORDER — ONDANSETRON HCL 4 MG PO TABS: 4.0000 mg | ORAL_TABLET | Freq: Four times a day (QID) | ORAL | Status: DC | PRN

## 2014-12-03 MED ORDER — PROMETHAZINE HCL 25 MG/ML IJ SOLN: 6.2500 mg | INTRAMUSCULAR | Status: DC | PRN

## 2014-12-03 MED ORDER — ONDANSETRON HCL 4 MG/2ML IJ SOLN
INTRAMUSCULAR | Status: DC | PRN
  Administered 2014-12-03: 4 mg via INTRAVENOUS

## 2014-12-03 MED ORDER — GLYCOPYRROLATE 0.2 MG/ML IJ SOLN
INTRAMUSCULAR | Status: DC | PRN
  Administered 2014-12-03: 0.6 mg via INTRAVENOUS

## 2014-12-03 MED ORDER — MAGNESIUM CITRATE PO SOLN: 1.0000 | Freq: Once | ORAL | Status: DC | PRN

## 2014-12-03 MED ORDER — EPHEDRINE SULFATE 50 MG/ML IJ SOLN
INTRAMUSCULAR | Status: DC | PRN
  Administered 2014-12-03: 10 mg via INTRAVENOUS

## 2014-12-03 MED ORDER — ACETAMINOPHEN 325 MG PO TABS
650.0000 mg | ORAL_TABLET | Freq: Four times a day (QID) | ORAL | Status: DC | PRN
  Administered 2014-12-04 – 2014-12-06 (×5): 650 mg via ORAL
  Filled 2014-12-03 (×5): qty 2

## 2014-12-03 MED ORDER — LIDOCAINE HCL (CARDIAC) 20 MG/ML IV SOLN
INTRAVENOUS | Status: AC
  Filled 2014-12-03: qty 5

## 2014-12-03 MED ORDER — OXYCODONE-ACETAMINOPHEN 5-325 MG PO TABS: 1.0000 | ORAL_TABLET | ORAL | Status: DC | PRN

## 2014-12-03 MED ORDER — ONDANSETRON HCL 4 MG/2ML IJ SOLN: 4.0000 mg | Freq: Four times a day (QID) | INTRAMUSCULAR | Status: DC | PRN

## 2014-12-03 MED ORDER — MIDAZOLAM HCL 2 MG/2ML IJ SOLN
INTRAMUSCULAR | Status: AC
  Filled 2014-12-03: qty 4

## 2014-12-03 MED ORDER — FENTANYL CITRATE (PF) 100 MCG/2ML IJ SOLN
INTRAMUSCULAR | Status: DC | PRN
  Administered 2014-12-03: 50 ug via INTRAVENOUS
  Administered 2014-12-03 (×2): 100 ug via INTRAVENOUS
  Administered 2014-12-03: 50 ug via INTRAVENOUS

## 2014-12-03 MED ORDER — CLONAZEPAM 1 MG PO TABS: 1.0000 mg | ORAL_TABLET | Freq: Every evening | ORAL | Status: DC | PRN

## 2014-12-03 MED ORDER — METHOCARBAMOL 500 MG PO TABS
ORAL_TABLET | ORAL | Status: AC
  Administered 2014-12-03: 500 mg
  Filled 2014-12-03: qty 1

## 2014-12-03 MED ORDER — ROCURONIUM BROMIDE 100 MG/10ML IV SOLN
INTRAVENOUS | Status: DC | PRN
  Administered 2014-12-03: 40 mg via INTRAVENOUS
  Administered 2014-12-03: 10 mg via INTRAVENOUS

## 2014-12-03 MED ORDER — 0.9 % SODIUM CHLORIDE (POUR BTL) OPTIME
TOPICAL | Status: DC | PRN
  Administered 2014-12-03: 1000 mL

## 2014-12-03 MED ORDER — BUPIVACAINE HCL (PF) 0.5 % IJ SOLN
INTRAMUSCULAR | Status: DC | PRN
Start: 2014-12-03 — End: 2014-12-03
  Administered 2014-12-03: 10 mL

## 2014-12-03 MED ORDER — PHENOL 1.4 % MT LIQD: 1.0000 | OROMUCOSAL | Status: DC | PRN

## 2014-12-03 MED ORDER — DEXAMETHASONE SODIUM PHOSPHATE 10 MG/ML IJ SOLN
10.0000 mg | Freq: Once | INTRAMUSCULAR | Status: AC
  Administered 2014-12-04: 10 mg via INTRAVENOUS
  Filled 2014-12-03: qty 1

## 2014-12-03 MED ORDER — CLONAZEPAM 1 MG PO TABS: 1.0000 mg | ORAL_TABLET | Freq: Every day | ORAL | Status: DC

## 2014-12-03 MED ORDER — METHOCARBAMOL 1000 MG/10ML IJ SOLN: 500.0000 mg | Freq: Four times a day (QID) | INTRAVENOUS | Status: DC | PRN

## 2014-12-03 MED ORDER — LACTATED RINGERS IV SOLN
INTRAVENOUS | Status: DC
  Administered 2014-12-03: 10:00:00 via INTRAVENOUS

## 2014-12-03 SURGICAL SUPPLY — 62 items
APL SKNCLS STERI-STRIP NONHPOA (GAUZE/BANDAGES/DRESSINGS) ×1
BENZOIN TINCTURE PRP APPL 2/3 (GAUZE/BANDAGES/DRESSINGS) ×2 IMPLANT
BLADE SAW SGTL 18X1.27X75 (BLADE) ×2 IMPLANT
BLADE SAW SGTL 18X1.27X75MM (BLADE) ×1
BLADE SURG 10 STRL SS (BLADE) ×2 IMPLANT
BLADE SURG ROTATE 9660 (MISCELLANEOUS) IMPLANT
CAPT HIP TOTAL 2 ×2 IMPLANT
CLOSURE WOUND 1/2 X4 (GAUZE/BANDAGES/DRESSINGS) ×1
COVER SURGICAL LIGHT HANDLE (MISCELLANEOUS) ×3 IMPLANT
DRAPE IMP U-DRAPE 54X76 (DRAPES) ×7 IMPLANT
DRAPE INCISE IOBAN 66X45 STRL (DRAPES) ×3 IMPLANT
DRAPE ORTHO SPLIT 77X108 STRL (DRAPES) ×6
DRAPE PROXIMA HALF (DRAPES) ×3 IMPLANT
DRAPE SURG 17X23 STRL (DRAPES) ×1 IMPLANT
DRAPE SURG ORHT 6 SPLT 77X108 (DRAPES) ×2 IMPLANT
DRAPE U-SHAPE 47X51 STRL (DRAPES) ×3 IMPLANT
DRSG AQUACEL AG ADV 3.5X10 (GAUZE/BANDAGES/DRESSINGS) ×3 IMPLANT
DURAPREP 26ML APPLICATOR (WOUND CARE) ×3 IMPLANT
ELECT BLADE 4.0 EZ CLEAN MEGAD (MISCELLANEOUS) ×3
ELECT CAUTERY BLADE 6.4 (BLADE) ×3 IMPLANT
ELECT REM PT RETURN 9FT ADLT (ELECTROSURGICAL) ×3
ELECTRODE BLDE 4.0 EZ CLN MEGD (MISCELLANEOUS) IMPLANT
ELECTRODE REM PT RTRN 9FT ADLT (ELECTROSURGICAL) ×1 IMPLANT
FACESHIELD WRAPAROUND (MASK) ×9 IMPLANT
FACESHIELD WRAPAROUND OR TEAM (MASK) ×2 IMPLANT
GLOVE BIO SURGEON STRL SZ7 (GLOVE) ×8 IMPLANT
GLOVE BIOGEL PI IND STRL 7.0 (GLOVE) IMPLANT
GLOVE BIOGEL PI IND STRL 7.5 (GLOVE) IMPLANT
GLOVE BIOGEL PI INDICATOR 7.0 (GLOVE) ×2
GLOVE BIOGEL PI INDICATOR 7.5 (GLOVE) ×2
GLOVE ECLIPSE 7.0 STRL STRAW (GLOVE) IMPLANT
GLOVE ORTHO TXT STRL SZ7.5 (GLOVE) ×6 IMPLANT
GLOVE SURG SS PI 7.5 STRL IVOR (GLOVE) ×4 IMPLANT
GOWN STRL REUS W/ TWL LRG LVL3 (GOWN DISPOSABLE) ×3 IMPLANT
GOWN STRL REUS W/ TWL XL LVL3 (GOWN DISPOSABLE) ×1 IMPLANT
GOWN STRL REUS W/TWL LRG LVL3 (GOWN DISPOSABLE) ×6
GOWN STRL REUS W/TWL XL LVL3 (GOWN DISPOSABLE) ×6
KIT BASIN OR (CUSTOM PROCEDURE TRAY) ×3 IMPLANT
KIT ROOM TURNOVER OR (KITS) ×3 IMPLANT
MANIFOLD NEPTUNE II (INSTRUMENTS) ×3 IMPLANT
NDL SAFETY ECLIPSE 18X1.5 (NEEDLE) ×1 IMPLANT
NEEDLE 22X1 1/2 (OR ONLY) (NEEDLE) ×2 IMPLANT
NEEDLE HYPO 18GX1.5 SHARP (NEEDLE) ×3
NS IRRIG 1000ML POUR BTL (IV SOLUTION) ×3 IMPLANT
PACK TOTAL JOINT (CUSTOM PROCEDURE TRAY) ×3 IMPLANT
PACK UNIVERSAL I (CUSTOM PROCEDURE TRAY) ×3 IMPLANT
PAD ARMBOARD 7.5X6 YLW CONV (MISCELLANEOUS) ×6 IMPLANT
STRIP CLOSURE SKIN 1/2X4 (GAUZE/BANDAGES/DRESSINGS) ×1 IMPLANT
SUCTION FRAZIER TIP 10 FR DISP (SUCTIONS) ×3 IMPLANT
SUT MNCRL AB 4-0 PS2 18 (SUTURE) ×3 IMPLANT
SUT VIC AB 0 CT1 27 (SUTURE) ×6
SUT VIC AB 0 CT1 27XBRD ANBCTR (SUTURE) ×1 IMPLANT
SUT VIC AB 1 CT1 27 (SUTURE) ×3
SUT VIC AB 1 CT1 27XBRD ANBCTR (SUTURE) IMPLANT
SUT VIC AB 2-0 CT1 27 (SUTURE) ×6
SUT VIC AB 2-0 CT1 TAPERPNT 27 (SUTURE) ×2 IMPLANT
SYR 50ML LL SCALE MARK (SYRINGE) ×3 IMPLANT
SYR CONTROL 10ML LL (SYRINGE) ×2 IMPLANT
TOWEL OR 17X24 6PK STRL BLUE (TOWEL DISPOSABLE) ×3 IMPLANT
TOWEL OR 17X26 10 PK STRL BLUE (TOWEL DISPOSABLE) ×3 IMPLANT
TRAY FOLEY CATH 14FR (SET/KITS/TRAYS/PACK) IMPLANT
YANKAUER SUCT BULB TIP NO VENT (SUCTIONS) ×2 IMPLANT

## 2014-12-03 NOTE — Anesthesia Preprocedure Evaluation (Addendum)
Anesthesia Evaluation  Patient identified by MRN, date of birth, ID band Patient awake    Reviewed: Allergy & Precautions, NPO status , Patient's Chart, lab work & pertinent test results  Airway Mallampati: II  TM Distance: >3 FB Neck ROM: Full    Dental no notable dental hx.    Pulmonary neg pulmonary ROS, asthma , pneumonia,    Pulmonary exam normal breath sounds clear to auscultation       Cardiovascular hypertension, Pt. on medications Normal cardiovascular exam Rhythm:Regular Rate:Normal     Neuro/Psych PSYCHIATRIC DISORDERS Anxiety Depression  Neuromuscular disease negative neurological ROS  negative psych ROS   GI/Hepatic Neg liver ROS, GERD  Medicated,  Endo/Other  negative endocrine ROS  Renal/GU negative Renal ROS     Musculoskeletal negative musculoskeletal ROS (+) Arthritis , Fibromyalgia -  Abdominal (+) + obese,   Peds  Hematology negative hematology ROS (+) anemia ,   Anesthesia Other Findings   Reproductive/Obstetrics negative OB ROS                          Anesthesia Physical Anesthesia Plan  ASA: II  Anesthesia Plan: General   Post-op Pain Management:    Induction: Intravenous  Airway Management Planned: Oral ETT  Additional Equipment:   Intra-op Plan:   Post-operative Plan: Extubation in OR  Informed Consent: I have reviewed the patients History and Physical, chart, labs and discussed the procedure including the risks, benefits and alternatives for the proposed anesthesia with the patient or authorized representative who has indicated his/her understanding and acceptance.   Dental advisory given  Plan Discussed with: CRNA  Anesthesia Plan Comments:        Anesthesia Quick Evaluation                                  Anesthesia Evaluation  Patient identified by MRN, date of birth, ID band Patient awake    Reviewed: Allergy & Precautions, H&P ,  NPO status , Patient's Chart, lab work & pertinent test results, reviewed documented beta blocker date and time   Airway Mallampati: II TM Distance: >3 FB Neck ROM: full    Dental   Pulmonary asthma , pneumonia ,          Cardiovascular negative cardio ROS      Neuro/Psych PSYCHIATRIC DISORDERS  Neuromuscular disease    GI/Hepatic negative GI ROS, Neg liver ROS,   Endo/Other  negative endocrine ROSMorbid obesity  Renal/GU negative Renal ROS  negative genitourinary   Musculoskeletal   Abdominal   Peds  Hematology negative hematology ROS (+)   Anesthesia Other Findings See surgeon's H&P   Repeat EKG for eval of anxiety / dietary related chest discomfort. No changes noted. Patient without complaints.  Reproductive/Obstetrics negative OB ROS                           Anesthesia Physical Anesthesia Plan  ASA: III  Anesthesia Plan: General   Post-op Pain Management:    Induction: Intravenous  Airway Management Planned: Oral ETT  Additional Equipment:   Intra-op Plan:   Post-operative Plan: Extubation in OR  Informed Consent: I have reviewed the patients History and Physical, chart, labs and discussed the procedure including the risks, benefits and alternatives for the proposed anesthesia with the patient or authorized representative who has indicated his/her understanding and acceptance.  Plan Discussed with: CRNA and Surgeon  Anesthesia Plan Comments:         Anesthesia Quick Evaluation

## 2014-12-03 NOTE — Op Note (Signed)
NAMMarland Kitchen:  [name], [name]            ACCOUNT NO.:  [id number]  MEDICAL RECORD NO.:  [id number]  LOCATION:  5N21C                        FACILITY:  [facility]  PHYSICIAN:  Loreta Aveaniel F. Colleen Donahoe, M.D. DATE OF BIRTH:  05/07/1950  DATE OF PROCEDURE:  12/03/2014 DATE OF DISCHARGE:                              OPERATIVE REPORT   PREOPERATIVE DIAGNOSES:  Right hip end-stage arthritis, primary generalized.  Morbid obesity.  POSTOPERATIVE DIAGNOSES:  Right hip end-stage arthritis, primary generalized.  Morbid obesity.  PROCEDURE:  Direct anterior right total hip replacement utilizing Stryker prosthesis.  Press-fit 52 mm cup screw fixation x2.  A 36-mm internal diameter liner.  A press-fit #3 Accolade stem.  A 36+ 0 Biolox head.  SURGEON:  Loreta Aveaniel F. Kashara Blocher, M.D.  ASSISTANT:  Mikey KirschnerLindsey Stanberry, PA, present throughout the entire case and necessary for timely completion of procedure.  ANESTHESIA:  General.  BLOOD LOSS:  300 mL.  BLOOD GIVEN:  None.  SPECIMENS:  None.  CULTURES:  None.  COMPLICATIONS:  None.  DRESSINGS:  Soft compressive.  PROCEDURE IN DETAIL:  The patient was brought to the operating room. After adequate anesthesia had been obtained, positioned, prepped and draped for a direct anterior approach.  A longitudinal incision behind the anterior superior iliac spine going distally.  Skin and subcutaneous tissue divided.  Abundant fat.  Numerous vasculars, venous structures. Hemostasis with cautery.  Fascia over the tensor opened.  Muscle retracted anteriorly.  Abundant proliferative hypertrophic capsules, synovitis, torn, redundant labrum, all removed.  Femoral neck cut one fingerbreadth above the lesser trochanter.  Acetabulum exposed.  Cut to put in place.  Acetabulum brought up to good sizing bleeding bone for a 52-mm component.  This was placed at 40 degrees of abduction, minimal anteversion.  Good capturing and fixation, augmented with 2 screws through the cup.  A 36 mm  internal diameter liner.  Femur was freed up and delivered.  I used appropriate broaches to bring this up to good sizing and then it was fitted with a #3 Accolate stem.  After trials, I utilized a 36+ 0 Biolox head.  With this construct, equal leg lengths, great stability.  Wound irrigated.  Soft tissues injected with Exparel. Deep fascia closed with Vicryl.  Subcutaneous and subcuticular closure. Margins were injected with Marcaine.  Sterile compressive dressing applied.  Anesthesia reversed.  Brought to the recovery room.  Tolerated the surgery well.  No complications.     Loreta Aveaniel F. Teddy Rebstock, M.D.     DFM/MEDQ  D:  12/03/2014  T:  12/03/2014  Job:  [id number]

## 2014-12-03 NOTE — Interval H&P Note (Signed)
History and Physical Interval Note:  12/03/2014 8:29 AM  [name]  has presented today for surgery, with the diagnosis of djd right hip  The various methods of treatment have been discussed with the patient and family. After consideration of risks, benefits and other options for treatment, the patient has consented to  Procedure(s): RIGHT TOTAL HIP ARTHROPLASTY ANTERIOR APPROACH (Right) as a surgical intervention .  The patient's history has been reviewed, patient examined, no change in status, stable for surgery.  I have reviewed the patient's chart and labs.  Questions were answered to the patient's satisfaction.     Loreta Aveaniel F Murphy

## 2014-12-03 NOTE — Plan of Care (Signed)
Problem: Consults Goal: Diagnosis- Total Joint Replacement Outcome: Completed/Met Date Met:  12/03/14 Primary Total Hip right

## 2014-12-03 NOTE — H&P (View-Only) (Signed)
  [name] comes in for follow up.  Unrelenting worsening symptoms, right hip.  Marked groin pain.  Upsetting symptoms in her back.  Pain now radiating down to her knee.  Everything we have done in the past is no longer effective.  Greater trochanteric injection of the left hip helped to an extent there.  The right hip however has unrelenting intolerable symptoms.  Intraarticular injection done previously really did not help to much of an extent.  She has gotten to the point she is using a cane all the time.  She would like to pursue definitive treatment.   History and general exam is reviewed.  Of note, she is still at 230 pounds.  Ideally I would like to get her down to 200 pounds with a BMI of 35 before definitive treatment.    EXAMINATION: Gait is markedly antalgic with a Trendelenburg component on the right.  Virtually no rotation of her right hip.  Negative straight leg raise.    X-RAYS: Follow up x-rays show bone on bone right hip.  No cartilage space left at all.  Some changes on the left, not marked.    DISPOSITION:  The only viable option she has is right total hip replacement.  Direct anterior approach.  She has to get her weight down to 200 pounds before we can proceed.  Procedure, risks, benefits and complications reviewed.  Paperwork complete.  All questions answered.  We will see her back prior to operative intervention once she achieves the ideal weight.  I went over this with her at great length, spending more than 15 minutes face-to-face.  She understands and agrees.    Loreta Aveaniel F. Murphy, M.D.

## 2014-12-03 NOTE — Progress Notes (Signed)
Orthopedic Tech Progress Note Patient Details:  Sherry ReidConnie R Knox 1950/07/15 [id number]  Ortho Devices Ortho Device/Splint Location: applied ohf to bed Ortho Device/Splint Interventions: Ordered, Application   Jennye MoccasinHughes, Nakaila Freeze Craig 12/03/2014, 3:54 PM

## 2014-12-03 NOTE — Transfer of Care (Signed)
Immediate Anesthesia Transfer of Care Note  Patient: Sherry ReidConnie R Hoefer  Procedure(s) Performed: Procedure(s): RIGHT TOTAL HIP ARTHROPLASTY ANTERIOR APPROACH (Right)  Patient Location: PACU  Anesthesia Type:General  Level of Consciousness: sedated  Airway & Oxygen Therapy: Patient Spontanous Breathing and Patient connected to nasal cannula oxygen  Post-op Assessment: Report given to RN and Post -op Vital signs reviewed and stable  Post vital signs: Reviewed and stable  Last Vitals:  Filed Vitals:   12/03/14 0901  BP: 165/58  Pulse: 86  Temp: 36.6 C  Resp: 20    Complications: No apparent anesthesia complications

## 2014-12-03 NOTE — Discharge Summary (Addendum)
Patient ID: Sherry ReidConnie R Debold MRN: [id number] DOB/AGE: November 17, 1950 64 y.o.  Admit date: 12/03/2014 Discharge date: 12/07/2014  Admission Diagnoses:  Active Problems:   Primary localized osteoarthrosis of pelvic region   Discharge Diagnoses:  Same  Past Medical History  Diagnosis Date  . Pneumonia     hx  . Anemia     hx  . Blood transfusion     gallbladder 12  . Fibromyalgia   . Depression   . Arthritis     working on L knee, but right knee also bad  . Diverticulitis   . Hypertension   . GERD (gastroesophageal reflux disease)   . Asthma     ashmatic bronchitis hx  . Anxiety     Surgeries: Procedure(s): RIGHT TOTAL HIP ARTHROPLASTY ANTERIOR APPROACH on 12/03/2014   Consultants:    Discharged Condition: Improved  Hospital Course: [name] is an 64 y.o. female who was admitted 12/03/2014 for operative treatment of primary localized osteoarthritis of the right hip. Patient has severe unremitting pain that affects sleep, daily activities, and work/hobbies. After pre-op clearance the patient was taken to the operating room on 12/03/2014 and underwent  Procedure(s): RIGHT TOTAL HIP ARTHROPLASTY ANTERIOR APPROACH.  Patient with a pre-op Hb of 9.4 developed ABLA pod #1 with a Hb of 6.7.  Patient is currently stable but we will go ahead and transfuse with 2 units PRBC.  On pod#2, Hb was 8.1 and 7.6 on pod#2.  She is currently symptomatic and will transfuse with 2 more units prbc.   Patient was given perioperative antibiotics:      Anti-infectives    Start     Dose/Rate Route Frequency Ordered Stop   12/03/14 1615  ceFAZolin (ANCEF) IVPB 2 g/50 mL premix     2 g 100 mL/hr over 30 Minutes Intravenous Every 6 hours 12/03/14 1526 12/04/14 0414   12/03/14 0853  ceFAZolin (ANCEF) 2-3 GM-% IVPB SOLR    Comments:  Forte, Lindsi   : cabinet override      12/03/14 0853 12/03/14 2059   12/03/14 0851  ceFAZolin (ANCEF) IVPB 2 g/50 mL premix     2 g 100 mL/hr over 30  Minutes Intravenous On call to O.R. 12/03/14 0851 12/03/14 1015       Patient was given sequential compression devices, early ambulation, and chemoprophylaxis to prevent DVT.  Patient benefited maximally from hospital stay and there were no complications.    Recent vital signs:  Patient Vitals for the past 24 hrs:  BP Temp Temp src Pulse Resp SpO2  12/07/14 0651 128/67 mmHg 99 F (37.2 C) Oral (!) 102 18 100 %  12/06/14 2056 (!) 145/61 mmHg 99.7 F (37.6 C) Oral 100 16 100 %  12/06/14 1500 (!) 145/56 mmHg 98.6 F (37 C) Oral (!) 104 18 100 %  12/06/14 1008 (!) 140/54 mmHg 98.4 F (36.9 C) Oral 95 19 100 %     Recent laboratory studies:   Recent Labs  12/05/14 0520 12/06/14 0324  WBC 10.1 10.4  HGB 8.1* 7.6*  HCT 25.1* 23.2*  PLT 286 273  NA 140 141  K 3.5 3.6  CL 104 106  CO2 29 28  BUN 9 7  CREATININE 0.85 0.77  GLUCOSE 123* 122*  CALCIUM 9.4 9.3     Discharge Medications:     Medication List    STOP taking these medications        HYDROcodone-acetaminophen 5-325 MG tablet  Commonly known as:  NORCO/VICODIN  traMADol 50 MG tablet  Commonly known as:  ULTRAM      TAKE these medications        adeks chewable tablet  Chew 1 tablet by mouth daily.     ALPRAZolam 0.5 MG tablet  Commonly known as:  XANAX  Take 0.5 mg by mouth 3 (three) times daily as needed for anxiety.     apixaban 2.5 MG Tabs tablet  Commonly known as:  ELIQUIS  Take 1 tab po q12 hours x 35 days following surgery to prevent blood clots     calcium-vitamin D 250-100 MG-UNIT tablet  Take 1 tablet by mouth 2 (two) times daily.     cetirizine 10 MG tablet  Commonly known as:  ZYRTEC  Take 10 mg by mouth daily.     clonazePAM 1 MG tablet  Commonly known as:  KLONOPIN  Take 1 mg by mouth at bedtime as needed for anxiety. For anxiety     irbesartan 300 MG tablet  Commonly known as:  AVAPRO  Take 300 mg by mouth daily.     methocarbamol 500 MG tablet  Commonly known as:   ROBAXIN  Take 1 tablet by mouth 2 (two) times daily as needed.     ondansetron 4 MG tablet  Commonly known as:  ZOFRAN  Take 1 tablet (4 mg total) by mouth every 8 (eight) hours as needed for nausea or vomiting.     oxyCODONE-acetaminophen 5-325 MG tablet  Commonly known as:  ROXICET  Take 1-2 tablets by mouth every 4 (four) hours as needed.     POTASSIUM PO  Take 1 tablet by mouth daily. OTC     venlafaxine XR 75 MG 24 hr capsule  Commonly known as:  EFFEXOR-XR  Take 75 mg by mouth daily.        Diagnostic Studies: Dg Pelvis Portable  12/03/2014  CLINICAL DATA:  Right hip replacement. EXAM: PORTABLE PELVIS 1-2 VIEWS COMPARISON:  None. FINDINGS: Total right hip replacement. Soft tissue postsurgical changes. Hardware intact. Good alignment on AP view. No evidence of fracture. IMPRESSION: Total right hip replacement. Electronically Signed   By: Maisie Fus  Register   On: 12/03/2014 13:49    Disposition: 03-Skilled Nursing Facility    Follow-up Information    Follow up with Loreta Ave, MD. Schedule an appointment as soon as possible for a visit on 12/19/2014.   Specialty:  Orthopedic Surgery   Why:  Appointment on November 4th at 9:30am   Contact information:   [address] [place] [address] [phone]       Follow up with [facility].   Why:  They will contact you to schedule home therapy visits.   Contact information:   [address] [place] [address] [phone]        Signed: Otilio Saber 12/07/2014, 8:31 AM

## 2014-12-03 NOTE — Anesthesia Procedure Notes (Signed)
Procedure Name: Intubation Date/Time: 12/03/2014 10:27 AM Performed by: Orvilla FusATO, Fermon Ureta A Pre-anesthesia Checklist: Patient identified, Emergency Drugs available, Suction available, Patient being monitored and Timeout performed Patient Re-evaluated:Patient Re-evaluated prior to inductionOxygen Delivery Method: Circle system utilized Preoxygenation: Pre-oxygenation with 100% oxygen Intubation Type: IV induction Ventilation: Mask ventilation without difficulty Laryngoscope Size: Mac and 3 Grade View: Grade I Tube type: Oral Tube size: 7.0 mm Number of attempts: 1 Airway Equipment and Method: Stylet Placement Confirmation: positive ETCO2,  ETT inserted through vocal cords under direct vision and breath sounds checked- equal and bilateral Secured at: 21 cm Tube secured with: Tape Dental Injury: Teeth and Oropharynx as per pre-operative assessment

## 2014-12-04 ENCOUNTER — Encounter (HOSPITAL_COMMUNITY): Payer: Self-pay | Admitting: Orthopedic Surgery

## 2014-12-04 LAB — BASIC METABOLIC PANEL
Anion gap: 8 (ref 5–15)
BUN: 8 mg/dL (ref 6–20)
CO2: 26 mmol/L (ref 22–32)
CREATININE: 0.88 mg/dL (ref 0.44–1.00)
Calcium: 9.4 mg/dL (ref 8.9–10.3)
Chloride: 106 mmol/L (ref 101–111)
GFR calc Af Amer: >60 mL/min (ref >60)
GLUCOSE: 127 mg/dL — AB (ref 65–99)
Potassium: 3.4 mmol/L — ABNORMAL LOW (ref 3.5–5.1)
SODIUM: 140 mmol/L (ref 135–145)

## 2014-12-04 LAB — CBC
HCT: 21 % — ABNORMAL LOW (ref 36.0–46.0)
Hemoglobin: 6.7 g/dL — CL (ref 12.0–15.0)
MCH: 25.5 pg — AB (ref 26.0–34.0)
MCHC: 30.8 g/dL (ref 30.0–36.0)
MCV: 82.6 fL (ref 78.0–100.0)
PLATELETS: 320 10*3/uL (ref 150–400)
RBC: 2.59 MIL/uL — ABNORMAL LOW (ref 3.87–5.11)
RDW: 16.5 % — ABNORMAL HIGH (ref 11.5–15.5)
WBC: 8.5 10*3/uL (ref 4.0–10.5)

## 2014-12-04 LAB — PREPARE RBC (CROSSMATCH)

## 2014-12-04 MED ORDER — SODIUM CHLORIDE 0.9 % IV SOLN: Freq: Once | INTRAVENOUS | Status: DC

## 2014-12-04 MED ORDER — FUROSEMIDE 10 MG/ML IJ SOLN
20.0000 mg | Freq: Once | INTRAMUSCULAR | Status: AC
  Administered 2014-12-04: 20 mg via INTRAVENOUS
  Filled 2014-12-04 (×2): qty 2

## 2014-12-04 MED ORDER — APIXABAN 2.5 MG PO TABS: ORAL_TABLET | ORAL | Status: DC

## 2014-12-04 NOTE — Progress Notes (Signed)
Medication benefit check on Eliquis per pharmacy request- per [facility] patient's copay will be $18, prior authorization not required.

## 2014-12-04 NOTE — Progress Notes (Signed)
CRITICAL VALUE ALERT  Critical value received:  Hgl 6.7  Date of notification:  10/20  Time of notification:  6:07am  Critical value read back:Yes.    Nurse who received alert:  Caesar ChestnutAntonique Pantelis Elgersma, RN  MD notified (1st page):  Wilhemina CashB. Kelly, Maxie BetterPA/ T. Murphy, MD on call for D. Eulah PontMurphy, MD  Time of first page:  6:10am  MD notified (2nd page):B. Jewel BaizeKelly, PA/ T. Murphy, MD on call for D. Wynn BankerMurphy, M  Time of second page: 6.23am  Time of third page: 6:38am  Responding MD: Wilhemina CashB. Kelly, PA  Time MD responded:  [phone]

## 2014-12-04 NOTE — Care Management Note (Signed)
Case Management Note  Patient Details  Name: [name] MRN: [id number] Date of Birth: 1950-10-02  Subjective/Objective:         S/p right total hip arthroplasty           Action/Plan: Set up with [facility] for HHPT by MD office. OT recommended HHOT, contacted [name] at [facility] and set up HHOT. Spoke with patient, no change in discharge plan. Patient stated that she lives with her god daughter who will be able to assist her after discharge. Contacted [name] with [facility] and requested rolling walker, patient does not want a 3N1. Will continue to follow for discharge needs.    Expected Discharge Date:                  Expected Discharge Plan:  Home w Home Health Services  In-House Referral:  NA  Discharge planning Services  CM Consult  Post Acute Care Choice:  Durable Medical Equipment, Home Health Choice offered to:  Patient  DME Arranged:  Walker rolling DME Agency:  [facility]  [facility]  [facility]  [facility]  Status of Service:  In process, will continue to follow  Medicare Important Message Given:    Date Medicare IM Given:    Medicare IM give by:    Date Additional Medicare IM Given:    Additional Medicare Important Message give by:     If discussed at Long Length of Stay Meetings, dates discussed:    Additional Comments:  Monica BectonKrieg, Martel Galvan Watson, RN 12/04/2014, 3:14 PM

## 2014-12-04 NOTE — Discharge Instructions (Signed)
INSTRUCTIONS AFTER JOINT REPLACEMENT   o Remove items at home which could result in a fall. This includes throw rugs or furniture in walking pathways o ICE to the affected joint every three hours while awake for 30 minutes at a time, for at least the first 3-5 days, and then as needed for pain and swelling.  Continue to use ice for pain and swelling. You may notice swelling that will progress down to the foot and ankle.  This is normal after surgery.  Elevate your leg when you are not up walking on it.   o Continue to use the breathing machine you got in the hospital (incentive spirometer) which will help keep your temperature down.  It is common for your temperature to cycle up and down following surgery, especially at night when you are not up moving around and exerting yourself.  The breathing machine keeps your lungs expanded and your temperature down.  BLOOD THINNER: USE ELIQUIS AS DIRECTED FOR A TOTAL OF 35 DAYS FOLLOWING SURGERY TO PREVENT BLOOD CLOTS.    DIET:  As you were doing prior to hospitalization, we recommend a well-balanced diet.  DRESSING / WOUND CARE / SHOWERING  You may change your dressing 3-5 days after surgery.  Then change the dressing every day with sterile gauze.  Please use good hand washing techniques before changing the dressing.  Do not use any lotions or creams on the incision until instructed by your surgeon. and You may shower 3 days after surgery, but keep the wounds dry during showering.  You may use an occlusive plastic wrap (Press'n Seal for example), NO SOAKING/SUBMERGING IN THE BATHTUB.  If the bandage gets wet, change with a clean dry gauze.  If the incision gets wet, pat the wound dry with a clean towel.    WEIGHT BEARING   Weight bearing as tolerated with assist device (walker, cane, etc) as directed, use it as long as suggested by your surgeon or therapist, typically at least 4-6 weeks..    CONSTIPATION  Constipation is defined medically as fewer than  three stools per week and severe constipation as less than one stool per week.  Even if you have a regular bowel pattern at home, your normal regimen is likely to be disrupted due to multiple reasons following surgery.  Combination of anesthesia, postoperative narcotics, change in appetite and fluid intake all can affect your bowels.   YOU MUST use at least one of the following options; they are listed in order of increasing strength to get the job done.  They are all available over the counter, and you may need to use some, POSSIBLY even all of these options:    Drink plenty of fluids (prune juice may be helpful) and high fiber foods Colace 100 mg by mouth twice a day  Senokot for constipation as directed and as needed Dulcolax (bisacodyl), take with full glass of water  Miralax (polyethylene glycol) once or twice a day as needed.  If you have tried all these things and are unable to have a bowel movement in the first 3-4 days after surgery call either your surgeon or your primary doctor.    If you experience loose stools or diarrhea, hold the medications until you stool forms back up.  If your symptoms do not get better within 1 week or if they get worse, check with your doctor.  If you experience "the worst abdominal pain ever" or develop nausea or vomiting, please contact the office immediately for  further recommendations for treatment.   ITCHING:  If you experience itching with your medications, try taking only a single pain pill, or even half a pain pill at a time.  You can also use Benadryl over the counter for itching or also to help with sleep.   TED HOSE STOCKINGS:  Use stockings on both legs until for at least 2 weeks or as directed by physician office. They may be removed at night for sleeping.  MEDICATIONS:  See your medication summary on the After Visit Summary that nursing will review with you.  You may have some home medications which will be placed on hold until you complete the  course of blood thinner medication.  It is important for you to complete the blood thinner medication as prescribed.  PRECAUTIONS:  If you experience chest pain or shortness of breath - call 911 immediately for transfer to the hospital emergency department.   If you develop a fever greater that 101 F, purulent drainage from wound, increased redness or drainage from wound, foul odor from the wound/dressing, or calf pain - CONTACT YOUR SURGEON.                                                   FOLLOW-UP APPOINTMENTS:  If you do not already have a post-op appointment, please call the office for an appointment to be seen by your surgeon.  Guidelines for how soon to be seen are listed in your After Visit Summary, but are typically between 1-4 weeks after surgery.  OTHER INSTRUCTIONS:   Knee Replacement:  Do not place pillow under knee, focus on keeping the knee straight while resting. CPM instructions: 0-90 degrees, 2 hours in the morning, 2 hours in the afternoon, and 2 hours in the evening. Place foam block, curve side up under heel at all times except when in CPM or when walking.  DO NOT modify, tear, cut, or change the foam block in any way.  MAKE SURE YOU:   Understand these instructions.   Get help right away if you are not doing well or get worse.    Thank you for letting us be a part of your medical care team.  It is a privilege we respect greatly.  We hope these instructions will help you stay on track for a fast and full recovery!   Information on my medicine - ELIQUIS (apixaban)  This medication education was reviewed with me or my healthcare representative as part of my discharge preparation.  The pharmacist that spoke with me during my hospital stay was:  Fayne Norrie, [facility]  Why was Eliquis prescribed for you? Eliquis was prescribed for you to reduce the risk of blood clots forming after orthopedic surgery.    What do You need to know about Eliquis? Take your  Eliquis TWICE DAILY - one tablet in the morning and one tablet in the evening with or without food.  It would be best to take the dose about the same time each day.  If you have difficulty swallowing the tablet whole please discuss with your pharmacist how to take the medication safely.  Take Eliquis exactly as prescribed by your doctor and DO NOT stop taking Eliquis without talking to the doctor who prescribed the medication.  Stopping without other medication to take the place of Eliquis may increase your  risk of developing a clot.  After discharge, you should have regular check-up appointments with your healthcare provider that is prescribing your Eliquis.  What do you do if you miss a dose? If a dose of ELIQUIS is not taken at the scheduled time, take it as soon as possible on the same day and twice-daily administration should be resumed.  The dose should not be doubled to make up for a missed dose.  Do not take more than one tablet of ELIQUIS at the same time.  Important Safety Information A possible side effect of Eliquis is bleeding. You should call your healthcare provider right away if you experience any of the following: ? Bleeding from an injury or your nose that does not stop. ? Unusual colored urine (red or dark brown) or unusual colored stools (red or black). ? Unusual bruising for unknown reasons. ? A serious fall or if you hit your head (even if there is no bleeding).  Some medicines may interact with Eliquis and might increase your risk of bleeding or clotting while on Eliquis. To help avoid this, consult your healthcare provider or pharmacist prior to using any new prescription or non-prescription medications, including herbals, vitamins, non-steroidal anti-inflammatory drugs (NSAIDs) and supplements.  This website has more information on Eliquis (apixaban): http://www.eliquis.com/eliquis/home

## 2014-12-04 NOTE — Anesthesia Postprocedure Evaluation (Signed)
Anesthesia Post Note  Patient: Sherry ReidConnie R Knox  Procedure(s) Performed: Procedure(s) (LRB): RIGHT TOTAL HIP ARTHROPLASTY ANTERIOR APPROACH (Right)  Anesthesia type: General  Patient location: PACU  Post pain: Pain level controlled  Post assessment: Post-op Vital signs reviewed  Last Vitals: BP 122/67 mmHg  Pulse 103  Temp(Src) 37.1 C (Oral)  Resp 16  Ht 5\' 5"  (1.651 m)  Wt 216 lb (97.977 kg)  BMI 35.94 kg/m2  SpO2 100%  Post vital signs: Reviewed  Level of consciousness: sedated  Complications: No apparent anesthesia complications

## 2014-12-04 NOTE — Evaluation (Signed)
Physical Therapy Evaluation Patient Details Name: [name] MRN: [id number] DOB: 21-Jan-1951 Today's Date: 12/04/2014   History of Present Illness  64 y/o female s/p R direct approach THA (12/03/14) with PMH of B TKA  Clinical Impression  Pt admitted with above diagnosis. Pt currently with functional limitations due to the deficits listed below (see PT Problem List).  Pt will benefit from skilled PT to increase their independence and safety with mobility to allow discharge to the venue listed below.  Pt had the most difficulty with bed mobility, but was at MIN to MIN/guard with standing and gait.  Pt's gait limited by the fact she was receiving first unit of blood due to Hgb being 6.7.  Anticipate good progess during acute stay. Recommend HHPT, RW, and 3-1 BSC.     Follow Up Recommendations Home health PT    Equipment Recommendations  Rolling walker with 5" wheels;3in1 (PT) (Pt states has not had new ones in > 5 years and hers are old and rusty and locked in a storage shed)    Recommendations for Other Services       Precautions / Restrictions Restrictions Weight Bearing Restrictions: Yes RLE Weight Bearing: Weight bearing as tolerated      Mobility  Bed Mobility Overal bed mobility: Needs Assistance;+2 for physical assistance Bed Mobility: Supine to Sit     Supine to sit: Mod assist;+2 for physical assistance     General bed mobility comments: Pt requiring MOD A of 2 today with supine to sit with use of bed pad and to get trunk upright with multi-modal cueing for completing transfer.  Transfers Overall transfer level: Needs assistance Equipment used: Rolling walker (2 wheeled) Transfers: Sit to/from Stand Sit to Stand: Min assist         General transfer comment: MIN A for weight shift to allow R UE to let go of rail and transition to RW  Ambulation/Gait Ambulation/Gait assistance: Min guard Ambulation Distance (Feet): 12 Feet Assistive device: Rolling  walker (2 wheeled) Gait Pattern/deviations: Decreased stance time - right     General Gait Details: Gait limited by fact pt receiving first unit of blood due to Hgb of 6.7.  Pt with no c/o lightheadedness or dizziness.  Stairs            Wheelchair Mobility    Modified Rankin (Stroke Patients Only)       Balance Overall balance assessment: Needs assistance Sitting-balance support: Feet supported Sitting balance-Leahy Scale: Fair       Standing balance-Leahy Scale: Poor Standing balance comment: requires UE support                             Pertinent Vitals/Pain Pain Assessment: 0-10 Pain Score: 3  Pain Location: R hip and butt Pain Descriptors / Indicators: Burning Pain Intervention(s): Premedicated before session;Repositioned    Home Living Family/patient expects to be discharged to:: Private residence Living Arrangements: 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    Prior Function Level of Independence: Independent         Comments: Normally independent, but has been using  a RW last few weeks     Hand Dominance        Extremity/Trunk Assessment   Upper Extremity Assessment: Defer to OT evaluation           Lower Extremity Assessment: RLE deficits/detail RLE Deficits / Details: Limited ROM due to pain and guarding       Communication      Cognition Arousal/Alertness: Awake/alert Behavior During Therapy: WFL for tasks assessed/performed (difficulty maintaining focus) Overall Cognitive Status: Within Functional Limits for tasks assessed                       General Comments General comments (skin integrity, edema, etc.): Pt easily distracted and requires re-direction to stay on task, answer questions.    Exercises Total Joint Exercises Ankle Circles/Pumps: AROM;Both;20 reps Heel Slides: AAROM;Right;10 reps;Supine Hip ABduction/ADduction: AAROM;Right;10 reps;Supine      Assessment/Plan    PT Assessment Patient needs continued PT services  PT Diagnosis Difficulty walking   PT Problem List Decreased strength;Decreased range of motion;Decreased balance;Decreased mobility;Decreased knowledge of use of DME  PT Treatment Interventions Gait training;Stair training;Functional mobility training;Therapeutic activities;Therapeutic exercise;DME instruction;Balance training   PT Goals (Current goals can be found in the Care Plan section) Acute Rehab PT Goals Patient Stated Goal: Go home PT Goal Formulation: With patient Time For Goal Achievement: 12/11/14 Potential to Achieve Goals: Good    Frequency 7X/week   Barriers to discharge        Co-evaluation PT/OT/SLP Co-Evaluation/Treatment: Yes Reason for Co-Treatment: For patient/therapist safety PT goals addressed during session: Proper use of DME;Strengthening/ROM;Mobility/safety with mobility         End of Session Equipment Utilized During Treatment: Gait belt Activity Tolerance: Treatment limited secondary to medical complications (Comment);Patient tolerated treatment well Patient left: in chair;with family/visitor present;with call bell/phone within reach Nurse Communication: Mobility status         Time: 1058-1130 PT Time Calculation (min) (ACUTE ONLY): 32 min   Charges:   PT Evaluation $Initial PT Evaluation Tier I: 1 Procedure     PT G Codes:        Kendrah Lovern LUBECK 12/04/2014, 11:51 AM

## 2014-12-04 NOTE — Evaluation (Signed)
Occupational Therapy Evaluation Patient Details Name: [name] MRN: [id number] DOB: Oct 01, 1950 Today's Date: 12/04/2014    History of Present Illness 64 y/o female s/p R direct approach THA (12/03/14) with PMH of B TKA   Clinical Impression   Pt reports she was independent with ADLs PTA. Educated pt on use of 3 in 1; pt verbalized understanding. Pt plan to d/c home with intermittent supervision from friends and family as needed. Recommending HHOT in order to maximize independence and safety with ADLs and functional mobility. Pt would benefit from continued skilled OT in order to increase independence with LB ADLs and functional transfers required for safe d/c home.     Follow Up Recommendations  Home health OT;Supervision - Intermittent    Equipment Recommendations  3 in 1 bedside comode;Other (comment) (AE)    Recommendations for Other Services       Precautions / Restrictions Precautions Precautions: Fall Restrictions Weight Bearing Restrictions: Yes RLE Weight Bearing: Weight bearing as tolerated      Mobility Bed Mobility Overal bed mobility: Needs Assistance;+2 for physical assistance Bed Mobility: Supine to Sit     Supine to sit: Mod assist;+2 for physical assistance     General bed mobility comments: Pt requiring MOD A of 2 today with supine to sit with use of bed pad and to get trunk upright with multi-modal cueing for completing transfer.  Transfers Overall transfer level: Needs assistance Equipment used: Rolling walker (2 wheeled) Transfers: Sit to/from Stand Sit to Stand: Min assist         General transfer comment: MIN A for weight shift to allow R UE to let go of rail and transition to RW    Balance Overall balance assessment: Needs assistance Sitting-balance support: Feet supported Sitting balance-Leahy Scale: Fair     Standing balance support: Bilateral upper extremity supported Standing balance-Leahy Scale: Poor Standing balance  comment: RW for support                            ADL Overall ADL's : Needs assistance/impaired Eating/Feeding: Set up;Sitting   Grooming: Min guard;Standing               Lower Body Dressing: Moderate assistance;Sit to/from stand Lower Body Dressing Details (indicate cue type and reason): Unable to doff/don R sock Toilet Transfer: Minimal assistance;Ambulation;BSC;RW (BSC over toilet)           Functional mobility during ADLs: Min guard;Rolling walker General ADL Comments: No family present during OT eval. Pt very distractable during session, difficult to redirect. Educated pt on use of 3 in 1; pt verbalized understanding.      Vision     Perception     Praxis      Pertinent Vitals/Pain Pain Assessment: 0-10 Pain Score: 3  Pain Location: R hip and butt Pain Descriptors / Indicators: Burning Pain Intervention(s): Premedicated before session;Repositioned;Ice applied     Hand Dominance     Extremity/Trunk Assessment Upper Extremity Assessment Upper Extremity Assessment: Generalized weakness   Lower Extremity Assessment Lower Extremity Assessment: Defer to PT evaluation RLE Deficits / Details: Limited ROM due to pain and guarding RLE: Unable to fully assess due to pain       Communication Communication Communication: No difficulties   Cognition Arousal/Alertness: Awake/alert Behavior During Therapy: WFL for tasks assessed/performed (difficulty maintaining focus) Overall Cognitive Status: Within Functional Limits for tasks assessed  General Comments       Exercises       Shoulder Instructions      Home Living Family/patient expects to be discharged to:: Private residence Living Arrangements: Non-relatives/Friends (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      Prior Functioning/Environment Level of Independence: Independent        Comments: Normally independent, but has been using a RW last few weeks    OT Diagnosis: Generalized weakness;Acute pain   OT Problem List: Decreased strength;Impaired balance (sitting and/or standing);Decreased safety awareness;Decreased knowledge of use of DME or AE;Decreased knowledge of precautions;Pain   OT Treatment/Interventions: Self-care/ADL training;DME and/or AE instruction;Patient/family education    OT Goals(Current goals can be found in the care plan section) Acute Rehab OT Goals Patient Stated Goal: Go home OT Goal Formulation: With patient Time For Goal Achievement: 12/04/14 Potential to Achieve Goals: Good ADL Goals Pt Will Perform Grooming: standing;with supervision Pt Will Perform Lower Body Bathing: with supervision;sit to/from stand (with or without AE) Pt Will Perform Lower Body Dressing: with supervision;sit to/from stand (with or without AE) Pt Will Transfer to Toilet: with supervision;ambulating;bedside commode (BSC over toilet) Pt Will Perform Tub/Shower Transfer: with supervision;ambulating;3 in 1;rolling walker;Shower transfer  OT Frequency: Min 2X/week   Barriers to D/C: Decreased caregiver support          Co-evaluation PT/OT/SLP Co-Evaluation/Treatment: Yes Reason for Co-Treatment: For patient/therapist safety PT goals addressed during session: Proper use of DME;Strengthening/ROM;Mobility/safety with mobility OT goals addressed during session: ADL's and self-care      End of Session Equipment Utilized During Treatment: Gait belt;Rolling walker Nurse  Communication: Mobility status;Weight bearing status  Activity Tolerance: Patient tolerated treatment well Patient left: in chair;with call bell/phone within reach;with family/visitor present   Time: [id number] OT Time Calculation (min): 29 min Charges:  OT General Charges $OT Visit: 1 Procedure OT Evaluation $Initial OT Evaluation Tier I: 1 Procedure G-Codes:    Gaye AlkenBailey A Maycee Blasco M.S., OTR/L Pager: [phone]: [phone]  12/04/2014, 12:19 PM

## 2014-12-04 NOTE — Progress Notes (Signed)
Utilization review completed.  

## 2014-12-04 NOTE — Progress Notes (Signed)
Subjective: 1 Day Post-Op Procedure(s) (LRB): RIGHT TOTAL HIP ARTHROPLASTY ANTERIOR APPROACH (Right) Patient reports pain as moderate.  Patients main complaint this am is a sinus ha.  Otherwise, moderate pain in right hip.  No lightheadedness/dizziness despite low HB.  No chest pain/sob.  Negative flatus/bm.  Tolerating diet.  Objective: Vital signs in last 24 hours: Temp:  [97.9 F (36.6 C)-99.3 F (37.4 C)] 99.3 F (37.4 C) (10/20 0445) Pulse Rate:  [84-104] 98 (10/20 0445) Resp:  [10-20] 16 (10/20 0445) BP: (141-165)/(58-74) 145/66 mmHg (10/20 0445) SpO2:  [96 %-100 %] 100 % (10/20 0445) Weight:  [97.977 kg (216 lb)] 97.977 kg (216 lb) (10/19 0901)  Intake/Output from previous day: 10/19 0701 - 10/20 0700 In: 1586.7 [I.V.:1036.7; IV Piggyback:550] Out: 452 [Urine:2; Blood:450] Intake/Output this shift: Total I/O In: 736.7 [I.V.:686.7; IV Piggyback:50] Out: 2 [Urine:2]   Recent Labs  12/03/14 1154 12/04/14 0329  HGB 7.8* 6.7*    Recent Labs  12/03/14 1154 12/04/14 0329  WBC  --  8.5  RBC  --  2.59*  HCT 23.0* 21.0*  PLT  --  320    Recent Labs  12/03/14 1154 12/04/14 0329  NA 141 140  K 3.3* 3.4*  CL  --  106  CO2  --  26  BUN  --  8  CREATININE  --  0.88  GLUCOSE 144* 127*  CALCIUM  --  9.4   No results for input(s): LABPT, INR in the last 72 hours.  Neurologically intact Neurovascular intact Sensation intact distally Intact pulses distally Dorsiflexion/Plantar flexion intact Compartment soft  Negative homans bilaterally  Assessment/Plan: 1 Day Post-Op Procedure(s) (LRB): RIGHT TOTAL HIP ARTHROPLASTY ANTERIOR APPROACH (Right) Advance diet Up with therapy Discharge home with home health either today or tomorrow pending ABLA and how well she does with PT WBAT RLE-DA hip precautions ABLA-will transfuse with 2 units PRBC Dry dressing change prn  Otilio SaberM Lindsey Azara Gemme 12/04/2014, 6:58 AM

## 2014-12-05 LAB — BASIC METABOLIC PANEL
ANION GAP: 7 (ref 5–15)
BUN: 9 mg/dL (ref 6–20)
CALCIUM: 9.4 mg/dL (ref 8.9–10.3)
CO2: 29 mmol/L (ref 22–32)
Chloride: 104 mmol/L (ref 101–111)
Creatinine, Ser: 0.85 mg/dL (ref 0.44–1.00)
GLUCOSE: 123 mg/dL — AB (ref 65–99)
POTASSIUM: 3.5 mmol/L (ref 3.5–5.1)
Sodium: 140 mmol/L (ref 135–145)

## 2014-12-05 LAB — TYPE AND SCREEN
ABO/RH(D): B POS
Antibody Screen: NEGATIVE
Unit division: 0
Unit division: 0

## 2014-12-05 LAB — CBC
HCT: 25.1 % — ABNORMAL LOW (ref 36.0–46.0)
Hemoglobin: 8.1 g/dL — ABNORMAL LOW (ref 12.0–15.0)
MCH: 27 pg (ref 26.0–34.0)
MCHC: 32.3 g/dL (ref 30.0–36.0)
MCV: 83.7 fL (ref 78.0–100.0)
PLATELETS: 286 10*3/uL (ref 150–400)
RBC: 3 MIL/uL — ABNORMAL LOW (ref 3.87–5.11)
RDW: 15.9 % — ABNORMAL HIGH (ref 11.5–15.5)
WBC: 10.1 10*3/uL (ref 4.0–10.5)

## 2014-12-05 NOTE — Progress Notes (Signed)
Subjective: 2 Days Post-Op Procedure(s) (LRB): RIGHT TOTAL HIP ARTHROPLASTY ANTERIOR APPROACH (Right) Patient reports pain as mild.  No nausea/vomiting, chest pain/sob.  Negative flatus bm.  Tolerating diet.  Objective: Vital signs in last 24 hours: Temp:  [98.5 F (36.9 C)-100.8 F (38.2 C)] 99 F (37.2 C) (10/21 0605) Pulse Rate:  [93-103] 93 (10/21 0605) Resp:  [16-18] 18 (10/21 0605) BP: (92-145)/(49-69) 131/49 mmHg (10/21 0605) SpO2:  [97 %-100 %] 97 % (10/21 0605)  Intake/Output from previous day: 10/20 0701 - 10/21 0700 In: 1440 [P.O.:840; I.V.:600] Out: 1 [Urine:1] Intake/Output this shift:     Recent Labs  12/03/14 1154 12/04/14 0329 12/05/14 0520  HGB 7.8* 6.7* 8.1*    Recent Labs  12/04/14 0329 12/05/14 0520  WBC 8.5 10.1  RBC 2.59* 3.00*  HCT 21.0* 25.1*  PLT 320 286    Recent Labs  12/04/14 0329 12/05/14 0520  NA 140 140  K 3.4* 3.5  CL 106 104  CO2 26 29  BUN 8 9  CREATININE 0.88 0.85  GLUCOSE 127* 123*  CALCIUM 9.4 9.4   No results for input(s): LABPT, INR in the last 72 hours.  Neurologically intact Neurovascular intact Sensation intact distally Intact pulses distally Dorsiflexion/Plantar flexion intact Compartment soft  Negative homans bilaterally  Assessment/Plan: 2 Days Post-Op Procedure(s) (LRB): RIGHT TOTAL HIP ARTHROPLASTY ANTERIOR APPROACH (Right) Advance diet Up with therapy Discharge home with home health today following second session of PT as long as she does well WBAT RLE-anterior hip precautions ABLA- transfused with 2 units PRBC yesterday.  Hb went from 6.7 to 8.1.  She is currently asymptomatic so we will hold off on transfusing this am.  [name] [name] 12/05/2014, 7:43 AM

## 2014-12-05 NOTE — Progress Notes (Signed)
Called the PA and let her know that pt is now needing SNF placement d/t patient not having adequate amount of help once discharged. Received a telephone order for social work consult.

## 2014-12-05 NOTE — Progress Notes (Signed)
Occupational Therapy Treatment Patient Details Name: Sherry ReidConnie R Knox MRN: [id number] DOB: 1951/02/12 Today's Date: 12/05/2014    History of present illness 64 y/o female s/p R direct approach THA (12/03/14) with PMH of B TKA   OT comments  Pt making good progress toward OT goals. Pt able to perform toilet transfer ambulating to bathroom with min guard assist. Supervision for toilet hygiene while sitting and min guard for washing hands at sink. Pts d/c plan has been upgraded to SNF due to decreased access in home environment and decreased caregiver support. Pt would benefit from short term SNF for further rehab to maximize independence and safety required for safe return home. Continue to follow pt acutely.    Follow Up Recommendations  SNF    Equipment Recommendations  Other (comment) (defer to next venue )    Recommendations for Other Services      Precautions / Restrictions Precautions Precautions: Fall Restrictions Weight Bearing Restrictions: Yes RLE Weight Bearing: Weight bearing as tolerated       Mobility Bed Mobility Overal bed mobility: Needs Assistance Bed Mobility: Sit to Supine     Supine to sit: Min assist;Mod assist Sit to supine: Min assist   General bed mobility comments: Min A to guide RLE into bed  Transfers Overall transfer level: Needs assistance Equipment used: Rolling walker (2 wheeled) Transfers: Sit to/from Stand Sit to Stand: Min guard Stand pivot transfers: Min assist       General transfer comment: Min guard for safety. Sit <> stand from chair x 1, BSC x 1    Balance Overall balance assessment: Needs assistance Sitting-balance support: Feet supported Sitting balance-Leahy Scale: Good   Postural control: Posterior lean Standing balance support: Bilateral upper extremity supported Standing balance-Leahy Scale: Fair Standing balance comment: RW for support                   ADL Overall ADL's : Needs assistance/impaired      Grooming: Wash/dry hands;Min guard;Standing           Upper Body Dressing : Supervision/safety;Sitting (to don hospital gown)       Toilet Transfer: Min guard;Ambulation;BSC;RW (BSC over toilet)   Toileting- Clothing Manipulation and Hygiene: Supervision/safety;Sitting/lateral lean (for toilet hygiene)       Functional mobility during ADLs: Min guard;Rolling walker General ADL Comments: Close friend to pt present during OT session.       Vision                     Perception     Praxis      Cognition   Behavior During Therapy: WFL for tasks assessed/performed Overall Cognitive Status: Within Functional Limits for tasks assessed                       Extremity/Trunk Assessment               Exercises     Shoulder Instructions       General Comments      Pertinent Vitals/ Pain       Pain Assessment: 0-10 Pain Score: 6  Faces Pain Scale: Hurts little more Pain Location: R hip with mobility Pain Descriptors / Indicators: Aching Pain Intervention(s): Limited activity within patient's tolerance;Monitored during session;Repositioned;Ice applied  Home Living  Prior Functioning/Environment              Frequency Min 2X/week     Progress Toward Goals  OT Goals(current goals can now be found in the care plan section)  Progress towards OT goals: Progressing toward goals  Acute Rehab OT Goals Patient Stated Goal: go to SNF OT Goal Formulation: With patient  Plan Discharge plan needs to be updated    Co-evaluation                 End of Session Equipment Utilized During Treatment: Gait belt;Rolling walker   Activity Tolerance Patient tolerated treatment well   Patient Left in bed;with call bell/phone within reach;with family/visitor present;with SCD's reapplied   Nurse Communication          Time: 1610-9604 OT Time Calculation (min): 32  min  Charges: OT General Charges $OT Visit: 1 Procedure OT Treatments $Self Care/Home Management : 23-37 mins  Gaye Alken M.S., OTR/L Pager: [phone]  12/05/2014, 2:58 PM

## 2014-12-05 NOTE — Progress Notes (Signed)
Physical Therapy Treatment Patient Details Name: [name] MRN: [id number] DOB: Jan 29, 1951 Today's Date: 12/05/2014    History of Present Illness 64 y/o female s/p R direct approach THA (12/03/14) with PMH of B TKA    PT Comments    Pt was able to move better today but does not have home assistance as expected.  Her plan is upgraded to go to SNF for a week and then home as she has stairs to navigate to enter.  Has plan also for sporadic help at home when her time in SNF is up.  Follow Up Recommendations  SNF     Equipment Recommendations  Rolling walker with 5" wheels;3in1 (PT)    Recommendations for Other Services       Precautions / Restrictions Precautions Precautions: Fall Restrictions Weight Bearing Restrictions: Yes RLE Weight Bearing: Weight bearing as tolerated    Mobility  Bed Mobility Overal bed mobility: Needs Assistance Bed Mobility: Supine to Sit     Supine to sit: Min assist;Mod assist     General bed mobility comments: much improved over yesterday  Transfers Overall transfer level: Needs assistance Equipment used: Rolling walker (2 wheeled) Transfers: Sit to/from [employer] Sit to Stand: Min assist Stand pivot transfers: Min assist       General transfer comment: min assist to control balance initially and for transition to walker  Ambulation/Gait Ambulation/Gait assistance: Min assist;Min guard Ambulation Distance (Feet): 30 Feet Assistive device: Rolling walker (2 wheeled) Gait Pattern/deviations: Step-through pattern;Step-to pattern;Shuffle;Decreased stance time - right;Decreased step length - left;Wide base of support Gait velocity: reduced Gait velocity interpretation: Below normal speed for age/gender General Gait Details: better standing control and hgb better value today much safer to walk   Stairs            Wheelchair Mobility    Modified Rankin (Stroke Patients Only)       Balance Overall  balance assessment: Needs assistance Sitting-balance support: Feet supported Sitting balance-Leahy Scale: Good   Postural control: Posterior lean Standing balance support: Bilateral upper extremity supported Standing balance-Leahy Scale: Fair Standing balance comment: fair- dynamic balance                    Cognition Arousal/Alertness: Awake/alert Behavior During Therapy: Anxious Overall Cognitive Status: Within Functional Limits for tasks assessed                      Exercises      General Comments General comments (skin integrity, edema, etc.): pt is persistent about her concerns that her expected help has not been able to get work rearranged      Pertinent Vitals/Pain Pain Assessment: Faces Faces Pain Scale: Hurts little more Pain Location: R hip  Pain Descriptors / Indicators: Burning Pain Intervention(s): Limited activity within patient's tolerance;Premedicated before session    Home Living                      Prior Function            PT Goals (current goals can now be found in the care plan section) Acute Rehab PT Goals Patient Stated Goal: go to SNF Progress towards PT goals: Progressing toward goals    Frequency  7X/week    PT Plan Discharge plan needs to be updated    Co-evaluation             End of Session   Activity Tolerance: Patient limited by  fatigue Patient left: in chair;with call bell/phone within reach     Time: 1233-1259 PT Time Calculation (min) (ACUTE ONLY): 26 min  Charges:  $Gait Training: 8-22 mins $Therapeutic Activity: 8-22 mins                    G Codes:      Ivar DrapeStout, Vyom Brass E 12/05/2014, 1:43 PM   Samul Dadauth Yaretzy Olazabal, PT MS Acute Rehab Dept. Number: [facility] [id number] and [facility] [phone]

## 2014-12-05 NOTE — Progress Notes (Signed)
Physical Therapy Treatment Patient Details Name: [name] MRN: [id number] DOB: 03/13/1950 Today's Date: 12/05/2014    History of Present Illness 64 y/o female s/p R direct approach THA (12/03/14) with PMH of B TKA    PT Comments    Pt has been able to participate only in bed level work this afternoon due to pain and discomfort with residual edema and stiffness RLE.  Responded well to exercises but has not received meds as wanted yet so did not walk with PT.  Follow Up Recommendations  SNF     Equipment Recommendations  Rolling walker with 5" wheels;3in1 (PT)    Recommendations for Other Services       Precautions / Restrictions Precautions Precautions: Fall Restrictions Weight Bearing Restrictions: Yes RLE Weight Bearing: Weight bearing as tolerated    Mobility  Bed Mobility Overal bed mobility: Needs Assistance Bed Mobility: Sit to Supine     Supine to sit: Min assist;Mod assist Sit to supine: Min assist   General bed mobility comments: shifting in bed with no assistance to lift hips but then min assist to scoot up with hips  Transfers Overall transfer level: Needs assistance Equipment used: Rolling walker (2 wheeled) Transfers: Sit to/from Stand Sit to Stand: Min guard Stand pivot transfers: Min assist       General transfer comment: declined over pain  Ambulation/Gait Ambulation/Gait assistance: Min assist;Min guard Ambulation Distance (Feet): 30 Feet Assistive device: Rolling walker (2 wheeled) Gait Pattern/deviations: Step-through pattern;Step-to pattern;Shuffle;Decreased stance time - right;Decreased step length - left;Wide base of support Gait velocity: reduced Gait velocity interpretation: Below normal speed for age/gender General Gait Details: declined over pain   Stairs            Wheelchair Mobility    Modified Rankin (Stroke Patients Only)       Balance Overall balance assessment: Needs assistance Sitting-balance  support: Feet supported Sitting balance-Leahy Scale: Good   Postural control: Posterior lean Standing balance support: Bilateral upper extremity supported Standing balance-Leahy Scale: Fair Standing balance comment: RW for support                    Cognition Arousal/Alertness: Awake/alert Behavior During Therapy: WFL for tasks assessed/performed Overall Cognitive Status: Within Functional Limits for tasks assessed                      Exercises Total Joint Exercises Ankle Circles/Pumps: AROM;Both;10 reps Quad Sets: AROM;Both;20 reps Gluteal Sets: AROM;Both;10 reps Towel Squeeze: AROM;Both;10 reps Heel Slides: AAROM;AROM;Both;15 reps Hip ABduction/ADduction: AROM;AAROM;Both;15 reps    General Comments General comments (skin integrity, edema, etc.): pt is persistent about her concerns that her expected help has not been able to get work rearranged      Pertinent Vitals/Pain Pain Assessment: 0-10 Pain Score: 8  Faces Pain Scale: Hurts little more Pain Location: r hip Pain Descriptors / Indicators: Aching;Operative site guarding Pain Intervention(s): Limited activity within patient's tolerance;Monitored during session;Premedicated before session;Repositioned;Ice applied    Home Living                      Prior Function            PT Goals (current goals can now be found in the care plan section) Acute Rehab PT Goals Patient Stated Goal: go to SNF Progress towards PT goals: Progressing toward goals    Frequency  7X/week    PT Plan Current plan remains appropriate    Co-evaluation  End of Session   Activity Tolerance: Patient limited by pain Patient left: in bed;with call bell/phone within reach;with family/visitor present     Time: 4782-95621525-1549 PT Time Calculation (min) (ACUTE ONLY): 24 min  Charges:  $Gait Training: 8-22 mins $Therapeutic Exercise: 8-22 mins $Therapeutic Activity: 8-22 mins                    G  Codes:      Ivar DrapeStout, Trayquan Kolakowski E 12/05/2014, 4:53 PM   Samul Dadauth Ronniesha Seibold, PT MS Acute Rehab Dept. Number: [facility] [phone] and [facility] [phone]

## 2014-12-06 LAB — CBC
HEMATOCRIT: 23.2 % — AB (ref 36.0–46.0)
Hemoglobin: 7.6 g/dL — ABNORMAL LOW (ref 12.0–15.0)
MCH: 27.3 pg (ref 26.0–34.0)
MCHC: 32.8 g/dL (ref 30.0–36.0)
MCV: 83.5 fL (ref 78.0–100.0)
PLATELETS: 273 10*3/uL (ref 150–400)
RBC: 2.78 MIL/uL — ABNORMAL LOW (ref 3.87–5.11)
RDW: 16.1 % — AB (ref 11.5–15.5)
WBC: 10.4 10*3/uL (ref 4.0–10.5)

## 2014-12-06 LAB — BASIC METABOLIC PANEL
ANION GAP: 7 (ref 5–15)
BUN: 7 mg/dL (ref 6–20)
CALCIUM: 9.3 mg/dL (ref 8.9–10.3)
CO2: 28 mmol/L (ref 22–32)
CREATININE: 0.77 mg/dL (ref 0.44–1.00)
Chloride: 106 mmol/L (ref 101–111)
GFR calc Af Amer: >60 mL/min (ref >60)
GLUCOSE: 122 mg/dL — AB (ref 65–99)
Potassium: 3.6 mmol/L (ref 3.5–5.1)
Sodium: 141 mmol/L (ref 135–145)

## 2014-12-06 NOTE — Progress Notes (Signed)
Subjective: 3 Days Post-Op Procedure(s) (LRB): RIGHT TOTAL HIP ARTHROPLASTY ANTERIOR APPROACH (Right) Patient reports pain as moderate.  No nausea/vomiting, lightheadedness/dizziness, chest pain/sob.  Positive flatus, no bm.  Tolerating diet.  Objective: Vital signs in last 24 hours: Temp:  [98.9 F (37.2 C)-100.5 F (38.1 C)] 98.9 F (37.2 C) (10/22 0411) Pulse Rate:  [95-97] 95 (10/22 0411) Resp:  [16-17] 16 (10/22 0411) BP: (137-138)/(53-62) 137/62 mmHg (10/22 0411) SpO2:  [100 %] 100 % (10/22 0411)  Intake/Output from previous day:   Intake/Output this shift:     Recent Labs  12/03/14 1154 12/04/14 0329 12/05/14 0520 12/06/14 0324  HGB 7.8* 6.7* 8.1* 7.6*    Recent Labs  12/05/14 0520 12/06/14 0324  WBC 10.1 10.4  RBC 3.00* 2.78*  HCT 25.1* 23.2*  PLT 286 273    Recent Labs  12/05/14 0520 12/06/14 0324  NA 140 141  K 3.5 3.6  CL 104 106  CO2 29 28  BUN 9 7  CREATININE 0.85 0.77  GLUCOSE 123* 122*  CALCIUM 9.4 9.3   No results for input(s): LABPT, INR in the last 72 hours.  Neurologically intact Neurovascular intact Sensation intact distally Intact pulses distally Dorsiflexion/Plantar flexion intact Compartment soft  Negative homans bilaterally  Assessment/Plan: 3 Days Post-Op Procedure(s) (LRB): RIGHT TOTAL HIP ARTHROPLASTY ANTERIOR APPROACH (Right) Advance diet Up with therapy D/C IV fluids Discharge to SNF as soon as insurance approves.  Hopefully today WBAT RLE-anterior hip precautions ABLA- 7.6 this am.   Baseline 9.4.  Patient is asymptomatic and I will hold off on transfusion today Dry dressing change prn  Otilio SaberM Lindsey Rayme Bui 12/06/2014, 9:39 AM

## 2014-12-06 NOTE — Progress Notes (Signed)
Physical Therapy Treatment Patient Details Name: Sherry ReidConnie R Knox MRN: [id number] DOB: 1950/08/05 Today's Date: 12/06/2014    History of Present Illness 64 y/o female s/p R direct approach THA (12/03/14) with PMH of B TKA    PT Comments    Pt making steady progress toward goals. Increased gait distance today with some reports of dizziness after turning quickly that resolved with deep breathing and standing rest break. Slow gait speed with guarded movements. Acute PT to continue toward goals during acute care stay.  Follow Up Recommendations  SNF     Equipment Recommendations  Rolling walker with 5" wheels;3in1 (PT)       Precautions / Restrictions Precautions Precautions: Fall Restrictions Weight Bearing Restrictions: Yes RLE Weight Bearing: Weight bearing as tolerated    Mobility  Bed Mobility   Bed Mobility: Sit to Supine       Sit to supine: Min assist   General bed mobility comments: Pt able to complete >90% using belt to lift right leg up onto bed, minimal assist needed to completly clear bed surface. Cues needed on technique/sequence.  Transfers Overall transfer level: Needs assistance Equipment used: Rolling walker (2 wheeled) Transfers: Sit to/from Stand Sit to Stand: Min guard         General transfer comment: from elevated toilet and to bed. cues on hand placement only.  Ambulation/Gait Ambulation/Gait assistance: Min guard Ambulation Distance (Feet): 110 Feet Assistive device: Rolling walker (2 wheeled) Gait Pattern/deviations: Step-through pattern;Decreased stride length;Trunk flexed;Decreased step length - right;Decreased step length - left;Wide base of support Gait velocity: decreased Gait velocity interpretation: Below normal speed for age/gender General Gait Details: cues on posture and to correct above gait deviations. cues to stay with walker during turns.       Cognition Arousal/Alertness: Awake/alert Behavior During Therapy: WFL  for tasks assessed/performed Overall Cognitive Status: Within Functional Limits for tasks assessed        Exercises Total Joint Exercises Quad Sets: AROM;Right;10 reps;Supine Heel Slides: AAROM;Strengthening;Right;10 reps;Supine Hip ABduction/ADduction: AAROM;Strengthening;Right;10 reps;Supine     Pertinent Vitals/Pain Pain Assessment: 0-10 Pain Score: 5  Pain Location: right hip Pain Descriptors / Indicators: Aching;Sore Pain Intervention(s): Limited activity within patient's tolerance;Monitored during session;Premedicated before session;Repositioned     PT Goals (current goals can now be found in the care plan section) Acute Rehab PT Goals Patient Stated Goal: go to SNF PT Goal Formulation: With patient Time For Goal Achievement: 12/11/14 Potential to Achieve Goals: Good Progress towards PT goals: Progressing toward goals    Frequency  7X/week    PT Plan Current plan remains appropriate    End of Session Equipment Utilized During Treatment: Gait belt Activity Tolerance: Patient tolerated treatment well Patient left: in bed;with call bell/phone within reach;with family/visitor present     Time: [id number] PT Time Calculation (min) (ACUTE ONLY): 23 min  Charges:  $Gait Training: 8-22 mins $Therapeutic Exercise: 8-22 mins           Sallyanne KusterBury, Whitni Pasquini 12/06/2014, 1:30 PM   Sallyanne KusterKathy Aziz Slape, PTA, CLT Acute Rehab Services [phone] 12/06/14, 1:32 PM

## 2014-12-06 NOTE — Clinical Social Work Placement (Addendum)
   CLINICAL SOCIAL WORK PLACEMENT  NOTE  Date:  12/06/2014  Patient Details  Name: [name] MRN: [id number] Date of Birth: 21-Apr-1950  Clinical Social Work is seeking post-discharge placement for this patient at the Skilled  Nursing Facility level of care (*CSW will initial, date and re-position this form in  chart as items are completed):  Yes   Patient/family provided with [place] Clinical Social Work Department's list of facilities offering this level of care within the geographic area requested by the patient (or if unable, by the patient's family).  Yes   Patient/family informed of their freedom to choose among providers that offer the needed level of care, that participate in Medicare, Medicaid or managed care program needed by the patient, have an available bed and are willing to accept the patient.  Yes   Patient/family informed of [facility]'s ownership interest in [facility] and [facility], as well as of the fact that they are under no obligation to receive care at these facilities.  PASRR submitted to EDS on 12/06/14     PASRR number received on 12/06/14     Existing PASRR number confirmed on 12/06/14     FL2 transmitted to all facilities in geographic area requested by pt/family on 12/06/14     FL2 transmitted to all facilities within larger geographic area on       Patient informed that his/her managed care company has contracts with or will negotiate with certain facilities, including the following:            Patient/family informed of bed offers received.  Patient chooses bed at       Physician recommends and patient chooses bed at      Patient to be transferred to   on  .  Patient to be transferred to facility by       Patient family notified on   of transfer.  Name of family member notified:        PHYSICIAN Please prepare prescriptions, Please sign FL2, Please prepare priority discharge summary, including medications      Additional Comment:   [phone]  Pass[facility] Number _______________________________________________ Raye Sorrowoble, Praise Dolecki N, LCSW 12/06/2014, 3:27 PM

## 2014-12-06 NOTE — Progress Notes (Signed)
[facility] but now per PT recommendation, SNF is needed. CM called and spoke with patient who is agreeable to discharge to SNF. CM called and spoke with SW Dahlia ClientHannah to advise of SNF placement and message accepted. CM informed that discharge was likely for today or possibly tomorrow. CM remains available for any further discharge planning needs.

## 2014-12-06 NOTE — Clinical Social Work Note (Signed)
Clinical Social Work Assessment  Patient Details  Name: [name] MRN: [id number] Date of Birth: [birth year]  Date of referral:  12/06/14               Reason for consult:  Facility Placement                Permission sought to share information with:  Case Manager, Magazine features editor, Family Supports Permission granted to share information::  Yes, Verbal Permission Granted  Name::        Agency::  Referrals for SNF  Relationship::  See facesheet.  Patient to speak with family, no one at the bedside currently  Contact Information:     Housing/Transportation Living arrangements for the past 2 months:  Single Family Home Source of Information:  Patient, Medical Team Patient Interpreter Needed:  None Criminal Activity/Legal Involvement Pertinent to Current Situation/Hospitalization:  No - Comment as needed Significant Relationships:  Adult Children, Other Family Members, Friend, Merchandiser, retail Lives with:  Adult Children (god-daughter) Do you feel safe going back to the place where you live?  No (pt reports "my bed is up high and my god daughter has to work and go to school"  I think I will need more help at home) Need for family participation in patient care:  No (Coment) (pt reports she will keep them informed)  Care giving concerns:  Patient denies any previous needs, but currently reports her god-daughter whom she lives with will be working and going to school full time and she is fearful to go home.  Patient reports she has had 2 knee replacements and a hip.  Wanting to go to SNF for short term... Possibly for a week or two to get stronger and go home with family.  Has very strong family/friends support per report.   Social Worker assessment / plan:  Received call from RN CM with regards to patient needing SNF rather than HH at this time. Patient without 24 hour supervision, but is moving well with PT per notes (110 feet with min assist). Patient requires  insurance authorization for SNF and LCSW explained potiental for denial due to patient doing so well. [facility] [place].  Will send referral to listed SNFs in [facility] for review as well as for insurance authorization. SNF pending insurance authorization at this time.  Employment status:  Retired Database administrator PT Recommendations:  Skilled Holiday representative, 24 Hour Supervision Information / Referral to community resources:  Skilled Nursing Facility  Patient/Family's Response to care:  Agreeable to plan  Patient/Family's Understanding of and Emotional Response to Diagnosis, Current Treatment, and Prognosis:  Patient very motivated to work with PT and walk and get stronger. Hopeful for SNF at DC due to fears at home with god-daughter being gone at night and during the day.  Aware of current prognosis and limited by fear with mobility.  Emotional Assessment Appearance:  Appears stated age Attitude/Demeanor/Rapport:  Other (engaged and motivated) Affect (typically observed):  Accepting, Hopeful, Happy Orientation:  Oriented to Self, Oriented to Place, Oriented to  Time, Oriented to Situation Alcohol / Substance use:  Not Applicable Psych involvement (Current and /or in the community):  No (Comment)  Discharge Needs  Concerns to be addressed:  Denies Needs/Concerns at this time Readmission within the last 30 days:  No Current discharge risk:  None Barriers to Discharge:  No Barriers Identified, Continued Medical Work up, English as a second language teacher (SNF at [place] per  request.  Awaiting insurance authorization)   Cordella RegisterCoble, Dekota Kirlin N, LCSW 12/06/2014, 3:07 PM

## 2014-12-07 LAB — HEMOGLOBIN AND HEMATOCRIT, BLOOD
HCT: 30.1 % — ABNORMAL LOW (ref 36.0–46.0)
HEMOGLOBIN: 9.8 g/dL — AB (ref 12.0–15.0)

## 2014-12-07 LAB — CBC WITH DIFFERENTIAL/PLATELET
BASOS ABS: 0 10*3/uL (ref 0.0–0.1)
BASOS PCT: 0 %
Eosinophils Absolute: 0.3 10*3/uL (ref 0.0–0.7)
Eosinophils Relative: 4 %
HEMATOCRIT: 25.3 % — AB (ref 36.0–46.0)
HEMOGLOBIN: 8 g/dL — AB (ref 12.0–15.0)
Lymphocytes Relative: 20 %
Lymphs Abs: 1.7 10*3/uL (ref 0.7–4.0)
MCH: 26.5 pg (ref 26.0–34.0)
MCHC: 31.6 g/dL (ref 30.0–36.0)
MCV: 83.8 fL (ref 78.0–100.0)
Monocytes Absolute: 0.6 10*3/uL (ref 0.1–1.0)
Monocytes Relative: 8 %
NEUTROS ABS: 5.8 10*3/uL (ref 1.7–7.7)
NEUTROS PCT: 68 %
Platelets: 334 10*3/uL (ref 150–400)
RBC: 3.02 MIL/uL — ABNORMAL LOW (ref 3.87–5.11)
RDW: 16 % — ABNORMAL HIGH (ref 11.5–15.5)
WBC: 8.5 10*3/uL (ref 4.0–10.5)

## 2014-12-07 LAB — PREPARE RBC (CROSSMATCH)

## 2014-12-07 MED ORDER — SODIUM CHLORIDE 0.9 % IV SOLN: Freq: Once | INTRAVENOUS | Status: DC

## 2014-12-07 MED ORDER — FUROSEMIDE 10 MG/ML IJ SOLN
20.0000 mg | Freq: Once | INTRAMUSCULAR | Status: AC
  Administered 2014-12-07: 20 mg via INTRAVENOUS
  Filled 2014-12-07: qty 2

## 2014-12-07 NOTE — Progress Notes (Signed)
Physical Therapy Treatment Patient Details Name: Sherry ReidConnie R Knox MRN: [id number] DOB: February 03, 1951 Today's Date: 12/07/2014    History of Present Illness 64 y/o female s/p R direct approach THA (12/03/14) with PMH of B TKA    PT Comments    Reports she is more sore today, but still working well the PT on therex and amb despite pain; Overall progressing well; Anticipate continuing good progress at post-acute rehabilitation.   Follow Up Recommendations  SNF     Equipment Recommendations  Rolling walker with 5" wheels;3in1 (PT)    Recommendations for Other Services       Precautions / Restrictions Precautions Precautions: Fall Restrictions RLE Weight Bearing: Weight bearing as tolerated    Mobility  Bed Mobility Overal bed mobility: Needs Assistance Bed Mobility: Supine to Sit     Supine to sit: Min assist     General bed mobility comments: Cues for technique; Noting better control of RLE without need to use a belt   Transfers Overall transfer level: Needs assistance Equipment used: Rolling walker (2 wheeled) Transfers: Sit to/from Stand Sit to Stand: Min guard         General transfer comment: Ceus for hand placement and safety  Ambulation/Gait Ambulation/Gait assistance: Min guard Ambulation Distance (Feet): 95 Feet Assistive device: Rolling walker (2 wheeled) Gait Pattern/deviations: Step-through pattern;Trunk flexed Gait velocity: decreased   General Gait Details: Cues to self-monitor for activity tolerance; no dizziness noted   Stairs            Wheelchair Mobility    Modified Rankin (Stroke Patients Only)       Balance             Standing balance-Leahy Scale: Fair                      Cognition Arousal/Alertness: Awake/alert Behavior During Therapy: WFL for tasks assessed/performed Overall Cognitive Status: Within Functional Limits for tasks assessed                      Exercises Total Joint  Exercises Ankle Circles/Pumps: AROM;Both;10 reps Quad Sets: AROM;Right;10 reps;Supine Gluteal Sets: AROM;Both;10 reps Towel Squeeze: AROM;Both;10 reps Heel Slides: AAROM;Strengthening;Right;10 reps;Supine Hip ABduction/ADduction: AAROM;Strengthening;Right;10 reps;Supine    General Comments        Pertinent Vitals/Pain Pain Assessment: 0-10 Pain Score: 5  Pain Location: R hip Pain Descriptors / Indicators: Aching;Sore Pain Intervention(s): Limited activity within patient's tolerance;Monitored during session;Repositioned (RN notified)    Home Living                      Prior Function            PT Goals (current goals can now be found in the care plan section) Acute Rehab PT Goals Patient Stated Goal: go to SNF PT Goal Formulation: With patient Time For Goal Achievement: 12/11/14 Potential to Achieve Goals: Good Progress towards PT goals: Progressing toward goals    Frequency  7X/week    PT Plan Current plan remains appropriate    Co-evaluation             End of Session   Activity Tolerance: Patient tolerated treatment well Patient left: in chair;with call bell/phone within reach     Time: 1010-1051 (minus approx 5 minutes for bathroom) PT Time Calculation (min) (ACUTE ONLY): 41 min  Charges:  $Gait Training: 8-22 mins $Therapeutic Exercise: 8-22 mins  G Codes:      [name] [facility] 12/07/2014, 12:55 PM  [name],   Acute Rehabilitation Services Pager [phone] Office [phone]

## 2014-12-07 NOTE — Clinical Social Work Note (Signed)
Patient to be d/c'ed today to [facility].  Patient and family agreeable to plans will transport via ems RN to call report.  Lamoyne Hessel, MSW, LCSWA [phone] 

## 2014-12-07 NOTE — Progress Notes (Signed)
Subjective: 4 Days Post-Op Procedure(s) (LRB): RIGHT TOTAL HIP ARTHROPLASTY ANTERIOR APPROACH (Right) Patient reports pain as mild.  Patient is very lethargic this am.  No lightheadedness/dizziness.  No nausea/vomiting, chest pain/sob.  Positive flatus/bm.  Tolerating diet.  Objective: Vital signs in last 24 hours: Temp:  [98.4 F (36.9 C)-99.7 F (37.6 C)] 99 F (37.2 C) (10/23 0651) Pulse Rate:  [95-104] 102 (10/23 0651) Resp:  [16-19] 18 (10/23 0651) BP: (128-145)/(54-67) 128/67 mmHg (10/23 0651) SpO2:  [100 %] 100 % (10/23 0651)  Intake/Output from previous day: 10/22 0701 - 10/23 0700 In: 480 [P.O.:480] Out: -  Intake/Output this shift:     Recent Labs  12/05/14 0520 12/06/14 0324  HGB 8.1* 7.6*    Recent Labs  12/05/14 0520 12/06/14 0324  WBC 10.1 10.4  RBC 3.00* 2.78*  HCT 25.1* 23.2*  PLT 286 273    Recent Labs  12/05/14 0520 12/06/14 0324  NA 140 141  K 3.5 3.6  CL 104 106  CO2 29 28  BUN 9 7  CREATININE 0.85 0.77  GLUCOSE 123* 122*  CALCIUM 9.4 9.3   No results for input(s): LABPT, INR in the last 72 hours.  Neurologically intact Neurovascular intact Sensation intact distally Intact pulses distally Dorsiflexion/Plantar flexion intact Compartment soft  No drainage noted through dressing Negative homans bilaterally  Assessment/Plan: 4 Days Post-Op Procedure(s) (LRB): RIGHT TOTAL HIP ARTHROPLASTY ANTERIOR APPROACH (Right) Advance diet Up with therapy Discharge to SNF today following blood transfusion WBAT RLE-DA hip precautions ABLA- although patient has low hb as baseline, she has become symptomatic today and will transfuse with 2 units of PRBC prior to d/c to SNF Dry dressing change prn  Otilio SaberM Lindsey Stanbery 12/07/2014, 8:28 AM

## 2014-12-08 ENCOUNTER — Encounter: Payer: Self-pay | Admitting: Adult Health

## 2014-12-08 ENCOUNTER — Non-Acute Institutional Stay (SKILLED_NURSING_FACILITY): Payer: 59 | Admitting: Adult Health

## 2014-12-08 DIAGNOSIS — I10 Essential (primary) hypertension: Secondary | ICD-10-CM

## 2014-12-08 DIAGNOSIS — E876 Hypokalemia: Secondary | ICD-10-CM

## 2014-12-08 DIAGNOSIS — F419 Anxiety disorder, unspecified: Secondary | ICD-10-CM | POA: Diagnosis not present

## 2014-12-08 DIAGNOSIS — J309 Allergic rhinitis, unspecified: Secondary | ICD-10-CM

## 2014-12-08 DIAGNOSIS — F329 Major depressive disorder, single episode, unspecified: Secondary | ICD-10-CM

## 2014-12-08 DIAGNOSIS — F32A Depression, unspecified: Secondary | ICD-10-CM

## 2014-12-08 DIAGNOSIS — D62 Acute posthemorrhagic anemia: Secondary | ICD-10-CM

## 2014-12-08 DIAGNOSIS — M1611 Unilateral primary osteoarthritis, right hip: Secondary | ICD-10-CM | POA: Diagnosis not present

## 2014-12-08 LAB — CBC AND DIFFERENTIAL
HCT: 29 % — AB (ref 36–46)
Hemoglobin: 9.3 g/dL — AB (ref 12.0–16.0)
PLATELETS: 343 10*3/uL (ref 150–399)
WBC: 8.7 10^3/mL

## 2014-12-08 LAB — BASIC METABOLIC PANEL
BUN: 11 mg/dL (ref 4–21)
CREATININE: 0.7 mg/dL (ref 0.5–1.1)
Glucose: 115 mg/dL
Potassium: 3.8 mmol/L (ref 3.4–5.3)
SODIUM: 141 mmol/L (ref 137–147)

## 2014-12-08 LAB — TYPE AND SCREEN
ABO/RH(D): B POS
Antibody Screen: NEGATIVE
UNIT DIVISION: 0
UNIT DIVISION: 0

## 2014-12-08 NOTE — Progress Notes (Signed)
Patient ID: [name], female   DOB: 1950-04-02, 64 y.o.   MRN: [id number]    DATE:  12/08/2014   MRN:  [id number]  BIRTHDAY: 30-Aug-1950  Facility:  Nursing Home Location:  [facility]  Nursing Home Room Number: 807-P  LEVEL OF CARE:  SNF [phone])  Contact Information    Name Relation Home Work Mobile   [place] Friend [phone]  [phone]   [facility] Friend [phone]     Naida Sleight [phone]     Leland Johns [phone]         Chief Complaint  Patient presents with  . Hospitalization Follow-up    Osteoarthritis S/P right total hip arthroplasty, hypertension, anxiety, hypokalemia, depression and anemia    HISTORY OF PRESENT ILLNESS:  This is a 64 year old female who has been admitted to [facility] on 12/07/14 from [facility] She has PMH of pneumonia, anemia, fibromyalgia, depression, diverticulitis, hypertension, GERD, asthma and anxiety. She has right hip osteoarthritis for which she had right total hip arthroplasty anterior approach on 12/03/14.  She has been admitted for a short-term rehabilitation.  PAST MEDICAL HISTORY:  Past Medical History  Diagnosis Date  . Pneumonia     hx  . Anemia     hx  . Blood transfusion     gallbladder 12  . Fibromyalgia   . Depression   . Arthritis     working on L knee, but right knee also bad  . Diverticulitis   . Hypertension   . GERD (gastroesophageal reflux disease)   . Asthma     ashmatic bronchitis hx  . Anxiety      CURRENT MEDICATIONS: Reviewed  Patient's Medications  New Prescriptions   No medications on file  Previous Medications   ALPRAZOLAM (XANAX) 0.5 MG TABLET    Take 0.5 mg by mouth 3 (three) times daily as needed for anxiety.    APIXABAN (ELIQUIS) 2.5 MG TABS TABLET    Take 1 tab po q12 hours x 35 days following surgery to prevent blood clots   CALCIUM-VITAMIN D 250-100 MG-UNIT PER TABLET    Take 1 tablet by mouth 2 (two)  times daily.   CETIRIZINE (ZYRTEC) 10 MG TABLET    Take 10 mg by mouth daily.   CLONAZEPAM (KLONOPIN) 1 MG TABLET    Take 1 mg by mouth at bedtime as needed for anxiety. For anxiety   IRBESARTAN (AVAPRO) 300 MG TABLET    Take 300 mg by mouth daily.   METHOCARBAMOL (ROBAXIN) 500 MG TABLET    Take 1 tablet by mouth 2 (two) times daily as needed.   MULTIPLE VITAMINS-MINERALS (ADEKS) CHEWABLE TABLET    Chew 1 tablet by mouth daily.   ONDANSETRON (ZOFRAN) 4 MG TABLET    Take 1 tablet (4 mg total) by mouth every 8 (eight) hours as needed for nausea or vomiting.   OXYCODONE-ACETAMINOPHEN (ROXICET) 5-325 MG TABLET    Take 1-2 tablets by mouth every 4 (four) hours as needed.   POTASSIUM PO    Take 1 tablet by mouth daily. OTC  20 meq   VENLAFAXINE (EFFEXOR-XR) 75 MG 24 HR CAPSULE    Take 75 mg by mouth daily.  Modified Medications   No medications on file  Discontinued Medications   No medications on file     Allergies  Allergen Reactions  . Pregabalin Nausea And Vomiting  . Shellfish Allergy Itching and Other (See Comments)    Severe hives  REVIEW OF SYSTEMS:  GENERAL: no change in appetite, no fatigue, no weight changes, no fever, chills or weakness EYES: Denies change in vision, dry eyes, eye pain, itching or discharge EARS: Denies change in hearing, ringing in ears, or earache NOSE: Denies nasal congestion or epistaxis MOUTH and THROAT: Denies oral discomfort, gingival pain or bleeding, pain from teeth or hoarseness   RESPIRATORY: no cough, SOB, DOE, wheezing, hemoptysis CARDIAC: no chest pain, edema or palpitations GI: no abdominal pain, diarrhea, constipation, heart burn, nausea or vomiting GU: Denies dysuria, frequency, hematuria, incontinence, or discharge PSYCHIATRIC: Denies feeling of depression or anxiety. No report of hallucinations, insomnia, paranoia, or agitation   PHYSICAL EXAMINATION  GENERAL APPEARANCE: Well nourished. In no acute distress. Obese SKIN:  Right hip  surgical incision is covered with Aquacel dressing, dry, no erythema HEAD: Normal in size and contour. No evidence of trauma EYES: Lids open and close normally. No blepharitis, entropion or ectropion. PERRL. Conjunctivae are clear and sclerae are white. Lenses are without opacity EARS: Pinnae are normal. Patient hears normal voice tunes of the examiner MOUTH and THROAT: Lips are without lesions. Oral mucosa is moist and without lesions. Tongue is normal in shape, size, and color and without lesions NECK: supple, trachea midline, no neck masses, no thyroid tenderness, no thyromegaly LYMPHATICS: no LAN in the neck, no supraclavicular LAN RESPIRATORY: breathing is even & unlabored, BS CTAB CARDIAC: RRR, no murmur,no extra heart sounds, no edema GI: abdomen soft, normal BS, no masses, no tenderness, no hepatomegaly, no splenomegaly EXTREMITIES:  Able to move 4 extremities PSYCHIATRIC: Alert and oriented X 3. Affect and behavior are appropriate  LABS/RADIOLOGY: Labs reviewed: Basic Metabolic Panel:  Recent Labs  09/81/19 0329 12/05/14 0520 12/06/14 0324  NA 140 140 141  K 3.4* 3.5 3.6  CL 106 104 106  CO2 GLUCOSE 127* 123* 122*  BUN CREATININE 0.88 0.85 0.77  CALCIUM 9.4 9.4 9.3   Liver Function Tests:  Recent Labs  11/21/14 0913  AST 37  ALT 40  ALKPHOS 89  BILITOT 0.3  PROT 6.6  ALBUMIN 4.0   CBC:  Recent Labs  11/21/14 0913  12/05/14 0520 12/06/14 0324 12/07/14 1000 12/07/14 2213  WBC 5.9  < > 10.1 10.4 8.5  --   NEUTROABS 3.3  --   --   --  5.8  --   HGB 9.4*  < > 8.1* 7.6* 8.0* 9.8*  HCT 29.5*  < > 25.1* 23.2* 25.3* 30.1*  MCV 82.6  < > 83.7 83.5 83.8  --   PLT 343  < > 286 273 334  --   < > = values in this interval not displayed.    Dg Pelvis Portable  12/03/2014  CLINICAL DATA:  Right hip replacement. EXAM: PORTABLE PELVIS 1-2 VIEWS COMPARISON:  None. FINDINGS: Total right hip replacement. Soft tissue postsurgical changes. Hardware  intact. Good alignment on AP view. No evidence of fracture. IMPRESSION: Total right hip replacement. Electronically Signed   By: Maisie Fus  Register   On: 12/03/2014 13:49    ASSESSMENT/PLAN:  Osteoarthritis S/P right total hip arthroplasty anterior approach - for rehabilitation; continue Eliquis 2.5 mg 1 tab by mouth every 12 hours 35 days for DVT prophylaxis; Robaxin 500 mg 1 tab by mouth twice a day when necessary for muscle spasm; Percocet 5/325 mg 1-2 tabs by mouth every 4 hours when necessary for pain; follow-up with Dr. Mckinley Jewel, orthopedic surgeon, on 12/19/14  Hypertension -  continue Avapro 300 mg 1 tab by mouth daily  Anxiety - mood this is stable; continue Xanax 0.5 mg 1 tab by mouth 3 times a day when necessary and discontinue Klonopin  Hypokalemia - K3.6; continue potassium 20 MDQ 1 tab by mouth daily; check BMP  Depression - continue Effexor XR 75 mg 1 capsule by mouth daily  Anemia, acute blood loss - hemoglobin 9.8; check CBC  Allergic rhinitis - continue Zyrtec 10 mg 1 tab by mouth daily     Goals of care:  Short-term rehabilitation    [facility] Hospital[facility]INA-VARGAS,Itzael Liptak, NP [facility] [phone]

## 2014-12-10 ENCOUNTER — Non-Acute Institutional Stay (SKILLED_NURSING_FACILITY): Payer: 59 | Admitting: Internal Medicine

## 2014-12-10 DIAGNOSIS — F418 Other specified anxiety disorders: Secondary | ICD-10-CM | POA: Diagnosis not present

## 2014-12-10 DIAGNOSIS — I10 Essential (primary) hypertension: Secondary | ICD-10-CM

## 2014-12-10 DIAGNOSIS — G4752 REM sleep behavior disorder: Secondary | ICD-10-CM | POA: Insufficient documentation

## 2014-12-10 DIAGNOSIS — E876 Hypokalemia: Secondary | ICD-10-CM | POA: Diagnosis not present

## 2014-12-10 DIAGNOSIS — M62838 Other muscle spasm: Secondary | ICD-10-CM | POA: Diagnosis not present

## 2014-12-10 DIAGNOSIS — R2681 Unsteadiness on feet: Secondary | ICD-10-CM

## 2014-12-10 DIAGNOSIS — D62 Acute posthemorrhagic anemia: Secondary | ICD-10-CM | POA: Diagnosis not present

## 2014-12-10 DIAGNOSIS — M1611 Unilateral primary osteoarthritis, right hip: Secondary | ICD-10-CM | POA: Diagnosis not present

## 2014-12-10 NOTE — Progress Notes (Signed)
Patient ID: [name], female   DOB: 1950/03/28, 64 y.o.   MRN: [id number]       [facility]  PCP: Pearson Grippe, MD  Code Status: Full Code  Allergies  Allergen Reactions  . Lactose Intolerance (Gi)   . Pregabalin Nausea And Vomiting  . Shellfish Allergy Itching and Other (See Comments)    Severe hives    Chief Complaint  Patient presents with  . New Admit To SNF    New Admission     HPI:  64 y.o. patient is here for short term rehabilitation post hospital admission from 12/03/14-12/07/14 with right hip OA. She underwent right total hip arthroplasty on 12/03/14. She is seen in her room today. Her pain is under control with current pain regimen. Complaints of muscle tightness. She has been having involuntary jerking movement and mentions being on clonazepam at home for this which helped. Had bowel movement this am. Has been working with therapy team. She has PMH of pneumonia, anemia, fibromyalgia, depression, diverticulitis, hypertension, GERD, asthma and anxiety.   Review of Systems:  Constitutional: Negative for fever, chills, diaphoresis.  HENT: Negative for headache, congestion, nasal discharge Eyes: Negative for eye pain, blurred vision, double vision and discharge.  Respiratory: Negative for cough, shortness of breath and wheezing.   Cardiovascular: Negative for chest pain, palpitations, leg swelling.  Gastrointestinal: Negative for heartburn, nausea, vomiting, abdominal pain Genitourinary: Negative for dysuria, flank pain.  Musculoskeletal: Negative for back pain, falls Skin: Negative for itching, rash.  Neurological: Negative for dizziness, tingling, focal weakness Psychiatric/Behavioral: Negative for depression   Past Medical History  Diagnosis Date  . Pneumonia     hx  . Anemia     hx  . Blood transfusion     gallbladder 12  . Fibromyalgia   . Depression   . Arthritis     working on L knee, but right knee also bad  . Diverticulitis     . Hypertension   . GERD (gastroesophageal reflux disease)   . Asthma     ashmatic bronchitis hx  . Anxiety    Past Surgical History  Procedure Laterality Date  . Tonsillectomy    . Abdominal hysterectomy    . Knee arthroscopy  rt  . Knee arthroplasty Right 02    rt    . Total knee arthroplasty  05/18/2011    Procedure: TOTAL KNEE ARTHROPLASTY;  Surgeon: Loreta Ave, MD;  Location: [facility] OR;  Service: Orthopedics;  Laterality: Left;  . Joint replacement    . Cholecystectomy    . Colonoscopy    . Total hip arthroplasty Right 12/03/2014    Procedure: RIGHT TOTAL HIP ARTHROPLASTY ANTERIOR APPROACH;  Surgeon: Loreta Ave, MD;  Location: [facility] OR;  Service: Orthopedics;  Laterality: Right;   Social History:   reports that she has never smoked. She does not have any smokeless tobacco history on file. She reports that she does not drink alcohol or use illicit drugs.  No family history on file.  Medications:   Medication List       This list is accurate as of: 12/10/14 11:01 AM.  Always use your most recent med list.               adeks chewable tablet  Chew 1 tablet by mouth daily.     ALPRAZolam 0.5 MG tablet  Commonly known as:  XANAX  Take 0.5 mg by mouth 3 (three) times daily as needed for anxiety.  apixaban 2.5 MG Tabs tablet  Commonly known as:  ELIQUIS  Take 1 tab po q12 hours x 35 days following surgery to prevent blood clots     calcium-vitamin D 250-100 MG-UNIT tablet  Take 1 tablet by mouth 2 (two) times daily.     cetirizine 10 MG tablet  Commonly known as:  ZYRTEC  Take 10 mg by mouth daily.     irbesartan 300 MG tablet  Commonly known as:  AVAPRO  Take 300 mg by mouth daily.     methocarbamol 500 MG tablet  Commonly known as:  ROBAXIN  Take 1 tablet by mouth 2 (two) times daily as needed.     ondansetron 4 MG tablet  Commonly known as:  ZOFRAN  Take 1 tablet (4 mg total) by mouth every 8 (eight) hours as needed for nausea or vomiting.      oxyCODONE-acetaminophen 5-325 MG tablet  Commonly known as:  ROXICET  Take 1-2 tablets by mouth every 4 (four) hours as needed.     POTASSIUM PO  Take 20meq by mouth once daily for potassium     venlafaxine XR 75 MG 24 hr capsule  Commonly known as:  EFFEXOR-XR  Take 75 mg by mouth daily.         Physical Exam: Filed Vitals:   12/10/14 1045  BP: 149/82  Pulse: 92  Temp: 98.7 F (37.1 C)  TempSrc: Oral  Resp: 20  Height: 5\' 3"  (1.6 m)  Weight: 222 lb 9.6 oz (100.971 kg)  SpO2: 93%    General- elderly female, obese and in no acute distress Head- normocephalic, atraumatic Nose- normal nasal mucosa Throat- moist mucus membrane Eyes- PERRLA, EOMI, no pallor, no icterus, no discharge, normal conjunctiva, normal sclera Neck- no cervical lymphadenopathy Cardiovascular- normal s1,s2, no murmurs, palpable dorsalis pedis and radial pulses, trace right leg edema Respiratory- bilateral clear to auscultation, no wheeze, no rhonchi, no crackles, no use of accessory muscles Abdomen- bowel sounds present, soft, non tender Musculoskeletal- able to move all 4 extremities, limited right hip range of motion  Neurological- no focal deficit, alert and oriented to person, place and time, involuntary jerks noted at times with both upper and lower extremities Skin- warm and dry, right hip surgical incision with aquacel dressing  Labs reviewed: Basic Metabolic Panel:  Recent Labs  86/57/8410/20/16 0329 12/05/14 0520 12/06/14 0324 12/08/14  NA 140 140 141 141  K 3.4* 3.5 3.6 3.8  CL 106 104 106  --   CO2 26 29 28   --   GLUCOSE 127* 123* 122*  --   BUN 8 9 7 11   CREATININE 0.88 0.85 0.77 0.7  CALCIUM 9.4 9.4 9.3  --    Liver Function Tests:  Recent Labs  11/21/14 0913  AST 37  ALT 40  ALKPHOS 89  BILITOT 0.3  PROT 6.6  ALBUMIN 4.0   No results for input(s): LIPASE, AMYLASE in the last 8760 hours. No results for input(s): AMMONIA in the last 8760 hours. CBC:  Recent Labs   11/21/14 0913  12/05/14 0520 12/06/14 0324 12/07/14 1000 12/07/14 2213 12/08/14  WBC 5.9  < > 10.1 10.4 8.5  --  8.7  NEUTROABS 3.3  --   --   --  5.8  --   --   HGB 9.4*  < > 8.1* 7.6* 8.0* 9.8* 9.3*  HCT 29.5*  < > 25.1* 23.2* 25.3* 30.1* 29*  MCV 82.6  < > 83.7 83.5 83.8  --   --  PLT 343  < > 286 273 334  --  343  < > = values in this interval not displayed.   Assessment/Plan  Unsteady gait Post recent right hip surgery. Will have patient work with PT/OT as tolerated to regain strength and restore function.  Fall precautions are in place.  Right hip Osteoarthritis  S/P right total hip arthroplasty anterior approach. Will have her work with physical therapy and occupational therapy team to help with gait training and muscle strengthening exercises.fall precautions. Skin care. Encourage to be out of bed. Continue eliquis 2.5 mg bid for dvt prophylaxis. Change robaxin to 500 mg bid for muscle spasm. Continue percocet 5-325 mg 1-2 tab q4h prn pain. Has f/u with orthopedics  Muscle spasm Change robaxin to 500 mg bid for now and monitor  Blood loss anemia Continue to monitor h&h  REM sleep behavior disorder As per patient has episodes of dropping down kind of feeling during sleep and also during daytime sometimes and has been on klonopin 1 mg bid prn. Reviewed hospital admission note and was on this regimen. Start klonopin 1 mg bid prn for now for same and hold for sedation  Hypertension Stable, continue Avapro 300 mg daily and monitor bp daily for now  Hypokalemia On kcl supplement, monitor bmp in 1 week  Depression and anxiety Stable, continue effexor xr 75 mg daily with xanax 0.5 mg tid prn for now   Goals of care: short term rehabilitation   Labs/tests ordered: cbc, bmp in 1 week  Family/ staff Communication: reviewed care plan with patient and nursing supervisor    Oneal Grout, MD  [facility] [phone] (Monday-Friday 8 am - 5  pm) [phone] (afterhours)

## 2014-12-18 ENCOUNTER — Non-Acute Institutional Stay (SKILLED_NURSING_FACILITY): Payer: 59 | Admitting: Adult Health

## 2014-12-18 ENCOUNTER — Encounter: Payer: Self-pay | Admitting: Adult Health

## 2014-12-18 DIAGNOSIS — F418 Other specified anxiety disorders: Secondary | ICD-10-CM | POA: Diagnosis not present

## 2014-12-18 DIAGNOSIS — J309 Allergic rhinitis, unspecified: Secondary | ICD-10-CM | POA: Diagnosis not present

## 2014-12-18 DIAGNOSIS — R2681 Unsteadiness on feet: Secondary | ICD-10-CM | POA: Diagnosis not present

## 2014-12-18 DIAGNOSIS — E876 Hypokalemia: Secondary | ICD-10-CM | POA: Diagnosis not present

## 2014-12-18 DIAGNOSIS — M1611 Unilateral primary osteoarthritis, right hip: Secondary | ICD-10-CM

## 2014-12-18 DIAGNOSIS — F419 Anxiety disorder, unspecified: Secondary | ICD-10-CM

## 2014-12-18 DIAGNOSIS — I10 Essential (primary) hypertension: Secondary | ICD-10-CM | POA: Diagnosis not present

## 2014-12-18 DIAGNOSIS — D62 Acute posthemorrhagic anemia: Secondary | ICD-10-CM | POA: Diagnosis not present

## 2014-12-29 ENCOUNTER — Other Ambulatory Visit: Payer: Self-pay | Admitting: Adult Health

## 2015-01-30 ENCOUNTER — Other Ambulatory Visit: Payer: Self-pay | Admitting: Adult Health

## 2015-02-04 ENCOUNTER — Other Ambulatory Visit: Payer: Self-pay | Admitting: Adult Health

## 2015-02-13 ENCOUNTER — Other Ambulatory Visit: Payer: Self-pay | Admitting: Adult Health

## 2015-02-19 ENCOUNTER — Other Ambulatory Visit: Payer: Self-pay | Admitting: Adult Health

## 2015-11-07 ENCOUNTER — Other Ambulatory Visit: Payer: Self-pay | Admitting: Neurological Surgery

## 2015-11-07 DIAGNOSIS — M545 Low back pain: Secondary | ICD-10-CM

## 2015-11-13 NOTE — Progress Notes (Signed)
Patient ID: [name], female   DOB: [birth year],    MRN: [id number]    DATE:    12/18/14  MRN:  [id number]  BIRTHDAY: [birth year]  Facility:  Nursing Home Location:  [facility]  Nursing Home Room Number: 807-P  LEVEL OF CARE:  SNF [phone])  Contact Information    Name Relation Home Work Mobile   [place] Friend [phone]   [place] Jaegers Friend [phone]     Marisa Sprinkles [phone]  [phone]   Leland Johns [phone]         Chief Complaint  Patient presents with  . Discharge Note    HISTORY OF PRESENT ILLNESS:  This is a 65 year old female who is for discharge home with Home health PT for endurance and OT for ADLs.  She has been admitted to [facility] on 12/07/14 from [facility]. She has PMH of pneumonia, anemia, fibromyalgia, depression, diverticulitis, hypertension, GERD, asthma and anxiety. She has right hip osteoarthritis for which she had right total hip arthroplasty anterior approach on 12/03/14.  Patient was admitted to this facility for short-term rehabilitation after the patient's recent hospitalization.  Patient has completed SNF rehabilitation and therapy has cleared the patient for discharge.   PAST MEDICAL HISTORY:  Past Medical History:  Diagnosis Date  . Anemia    hx  . Anxiety   . Arthritis    working on L knee, but right knee also bad  . Asthma    ashmatic bronchitis hx  . Blood transfusion    gallbladder 12  . Depression   . Diverticulitis   . Fibromyalgia   . GERD (gastroesophageal reflux disease)   . Hypertension   . Pneumonia    hx     CURRENT MEDICATIONS: Reviewed  Patient's Medications  New Prescriptions   No medications on file  Previous Medications   ALPRAZOLAM (XANAX) 0.5 MG TABLET    Take 0.5 mg by mouth 3 (three) times daily as needed for anxiety.    APIXABAN (ELIQUIS) 2.5 MG TABS TABLET    Take 1 tab po q12 hours x 35  days following surgery to prevent blood clots   CALCIUM-VITAMIN D 250-100 MG-UNIT PER TABLET    Take 1 tablet by mouth 2 (two) times daily.   CETIRIZINE (ZYRTEC) 10 MG TABLET    Take 10 mg by mouth daily.   IRBESARTAN (AVAPRO) 300 MG TABLET    Take 300 mg by mouth daily.   METHOCARBAMOL (ROBAXIN) 500 MG TABLET    Take 1 tablet by mouth 2 (two) times daily as needed.   MULTIPLE VITAMINS-MINERALS (ADEKS) CHEWABLE TABLET    Chew 1 tablet by mouth daily.   ONDANSETRON (ZOFRAN) 4 MG TABLET    Take 1 tablet (4 mg total) by mouth every 8 (eight) hours as needed for nausea or vomiting.   OXYCODONE-ACETAMINOPHEN (ROXICET) 5-325 MG TABLET    Take 1-2 tablets by mouth every 4 (four) hours as needed.   POTASSIUM PO    Take by mouth once daily for potassium   VENLAFAXINE (EFFEXOR-XR) 75 MG 24 HR CAPSULE    Take 75 mg by mouth daily.  Modified Medications   No medications on file  Discontinued Medications   No medications on file     Allergies  Allergen Reactions  . Lactose Intolerance (Gi)   . Pregabalin Nausea And Vomiting  . Shellfish Allergy Itching and Other (See Comments)    Severe hives     REVIEW OF  SYSTEMS:  GENERAL: no change in appetite, no fatigue, no weight changes, no fever, chills or weakness EYES: Denies change in vision, dry eyes, eye pain, itching or discharge EARS: Denies change in hearing, ringing in ears, or earache NOSE: Denies nasal congestion or epistaxis MOUTH and THROAT: Denies oral discomfort, gingival pain or bleeding, pain from teeth or hoarseness   RESPIRATORY: no cough, SOB, DOE, wheezing, hemoptysis CARDIAC: no chest pain, edema or palpitations GI: no abdominal pain, diarrhea, constipation, heart burn, nausea or vomiting GU: Denies dysuria, frequency, hematuria, incontinence, or discharge PSYCHIATRIC: Denies feeling of depression or anxiety. No report of hallucinations, insomnia, paranoia, or agitation   PHYSICAL EXAMINATION  GENERAL APPEARANCE: Well  nourished. In no acute distress. Obese SKIN:  Right hip surgical incision is covered with Aquacel dressing, dry, no erythema HEAD: Normal in size and contour. No evidence of trauma EYES: Lids open and close normally. No blepharitis, entropion or ectropion. PERRL. Conjunctivae are clear and sclerae are white. Lenses are without opacity EARS: Pinnae are normal. Patient hears normal voice tunes of the examiner MOUTH and THROAT: Lips are without lesions. Oral mucosa is moist and without lesions. Tongue is normal in shape, size, and color and without lesions NECK: supple, trachea midline, no neck masses, no thyroid tenderness, no thyromegaly LYMPHATICS: no LAN in the neck, no supraclavicular LAN RESPIRATORY: breathing is even & unlabored, BS CTAB CARDIAC: RRR, no murmur,no extra heart sounds, no edema GI: abdomen soft, normal BS, no masses, no tenderness, no hepatomegaly, no splenomegaly EXTREMITIES:  Able to move 4 extremities PSYCHIATRIC: Alert and oriented X 3. Affect and behavior are appropriate  LABS/RADIOLOGY: Labs reviewed: Basic Metabolic Panel:  Recent Labs  16/10/96 0329 12/05/14 0520 12/06/14 0324 12/08/14  NA 140 140 141 141  K 3.4* 3.5 3.6 3.8  CL 106 104 106  --   CO2 26 29 28   --   GLUCOSE 127* 123* 122*  --   BUN 8 9 7 11   CREATININE 0.88 0.85 0.77 0.7  CALCIUM 9.4 9.4 9.3  --    Liver Function Tests:  Recent Labs  11/21/14 0913  AST 37  ALT 40  ALKPHOS 89  BILITOT 0.3  PROT 6.6  ALBUMIN 4.0   CBC:  Recent Labs  11/21/14 0913  12/05/14 0520 12/06/14 0324 12/07/14 1000 12/07/14 2213 12/08/14  WBC 5.9  < > 10.1 10.4 8.5  --  8.7  NEUTROABS 3.3  --   --   --  5.8  --   --   HGB 9.4*  < > 8.1* 7.6* 8.0* 9.8* 9.3*  HCT 29.5*  < > 25.1* 23.2* 25.3* 30.1* 29*  MCV 82.6  < > 83.7 83.5 83.8  --   --   PLT 343  < > 286 273 334  --  343  < > = values in this interval not displayed.     ASSESSMENT/PLAN:   Unsteady gait - for Home health PT and OT,  for therapeutic strengthening exercises; fall precaution  Osteoarthritis S/P right total hip arthroplasty anterior approach - for Home health PT and OT, for therapeutic strengthening exercises ; continue Eliquis 2.5 mg 1 tab by mouth every 12 hours for DVT prophylaxis; Robaxin 500 mg 1 tab by mouth twice a day for muscle spasm; Percocet 5/325 mg 1-2 tabs by mouth every 4 hours when necessary for pain; follow-up with Dr. Mckinley Jewel, orthopedic surgeon  Hypertension - continue Avapro 300 mg 1 tab by mouth daily  Anxiety - mood  this is stable; continue Xanax 0.5 mg 1 tab by mouth 3 times a day when necessary   Hypokalemia - K4.7 ; discontinue potassium ; BMP in 1 week; follow-up with PCP  Depression - continue Effexor XR 75 mg 1 capsule by mouth daily  Anemia, acute blood loss - hemoglobin 9.8; re-check hgb 9.6, stable  Allergic rhinitis - continue Zyrtec 10 mg 1 tab by mouth daily       I have filled out patient's discharge paperwork and written prescriptions.  Patient will receive home health PT and OT.  DME provided:  None  Total discharge time: Less than 30 minutes  Discharge time involved coordination of the discharge process with social worker, nursing staff and therapy department. Medical justification for home health services verified.   Kenard GowerMonina Medina-Vargas, NP [facility] [phone]

## 2015-11-27 ENCOUNTER — Ambulatory Visit
Admission: RE | Admit: 2015-11-27 | Discharge: 2015-11-27 | Disposition: A | Discharge status: Home / Self Care | Payer: 59 | Source: Ambulatory Visit | Attending: Neurological Surgery | Admitting: Neurological Surgery

## 2015-11-27 DIAGNOSIS — M545 Low back pain, unspecified: Secondary | ICD-10-CM

## 2016-10-11 ENCOUNTER — Encounter: Payer: Self-pay | Admitting: Gastroenterology

## 2016-10-26 ENCOUNTER — Ambulatory Visit: Payer: 59 | Admitting: Gastroenterology

## 2016-11-09 ENCOUNTER — Encounter (INDEPENDENT_AMBULATORY_CARE_PROVIDER_SITE_OTHER): Payer: Self-pay

## 2016-11-09 ENCOUNTER — Encounter: Payer: Self-pay | Admitting: Nurse Practitioner

## 2016-11-09 ENCOUNTER — Ambulatory Visit (INDEPENDENT_AMBULATORY_CARE_PROVIDER_SITE_OTHER): Payer: 59 | Admitting: Nurse Practitioner

## 2016-11-09 VITALS — BP 140/80 | HR 84 | Ht 63.0 in | Wt 226.0 lb

## 2016-11-09 DIAGNOSIS — K219 Gastro-esophageal reflux disease without esophagitis: Secondary | ICD-10-CM

## 2016-11-09 DIAGNOSIS — R143 Flatulence: Secondary | ICD-10-CM | POA: Diagnosis not present

## 2016-11-09 DIAGNOSIS — R1310 Dysphagia, unspecified: Secondary | ICD-10-CM | POA: Diagnosis not present

## 2016-11-09 MED ORDER — PANTOPRAZOLE SODIUM 40 MG PO TBEC: 40.0000 mg | DELAYED_RELEASE_TABLET | Freq: Two times a day (BID) | ORAL | 3 refills | Status: DC

## 2016-11-09 NOTE — Progress Notes (Addendum)
Addendum 11/25/16. Records received from [facility]. Screening colonoscopy attempted 10/26/07 by Dr. Randa Evens. Technically difficult due to multiple diverticula, patient's combativeness. Procedure was aborted. Extent of exam only to the descending colon. Findings included multiple small and large mouth diverticula in the sigmoid and descending colon with associated narrowing. Very minimal was recommended at some point, I do not see any results and EPIC. Since exam was incomplete she has not had adequate colon cancer screening. Recommend Cologuard as she doesn't want a colonoscopy.  Will let patient know that if cologuard is abnormal however then we will still recommend colonoscopy        HPI:  Patient is a 66 year old female referred by Shanon Ace, NP for nausea and vomiting. Very difficult to get a history from patient. Extremely pleasant but cannot stay on topic for more than a few seconds and hard to get direct answers. Patient seen at [facility] urgent care 08/27/16 for nausea and vomiting. She takes multiple medications and was seeing more than one provider so patient was confused about what she needed to be taking. [facility] provider helped sort out medications. Diclofenac was discontinued. At some point, either at one of the urgent care visits or when seen by Dr. Pecola Leisure, patient was started on daily PPI before breakfast . The nausea and vomiting have resolved,  I am not entirely clear about the reason for today's visit. Patient does mention having problems with almost anything she eats which results in either gas or heartburn. She does admit to occasional solid food dysphagia. If she cuts meat into small piece and tucks her chin then she usually does okay. No weight loss.    Past Medical History:  Diagnosis Date  . Anemia    hx  . Anxiety   . Arthritis    working on L knee, but right knee also bad  . Asthma    ashmatic bronchitis hx  . Blood transfusion    gallbladder  12  . Depression   . Diverticulitis   . Fibromyalgia   . GERD (gastroesophageal reflux disease)   . Hypertension   . Pneumonia    hx  . Sleep apnea      Past Surgical History:  Procedure Laterality Date  . ABDOMINAL HYSTERECTOMY    . CHOLECYSTECTOMY    . COLONOSCOPY    . KNEE ARTHROPLASTY Right 02   rt    . KNEE ARTHROSCOPY  rt  . TONSILLECTOMY    . TOTAL HIP ARTHROPLASTY Right 12/03/2014   Procedure: RIGHT TOTAL HIP ARTHROPLASTY ANTERIOR APPROACH;  Surgeon: Loreta Ave, MD;  Location: [facility] OR;  Service: Orthopedics;  Laterality: Right;  . TOTAL KNEE ARTHROPLASTY  05/18/2011   Procedure: TOTAL KNEE ARTHROPLASTY;  Surgeon: Loreta Ave, MD;  Location: [facility] OR;  Service: Orthopedics;  Laterality: Left;   Family History  Problem Relation Age of Onset  . Adopted: Yes   Social History  Substance Use Topics  . Smoking status: Never Smoker  . Smokeless tobacco: Never Used  . Alcohol use Yes     Comment: on holidays   Current Outpatient Prescriptions  Medication Sig Dispense Refill  . Acetaminophen (TYLENOL ARTHRITIS EXT RELIEF PO) Take by mouth as needed.    . Calcium Carbonate-Vitamin D (CALCIUM 500 + D) 500-125 MG-UNIT TABS Take by mouth daily.    . clonazePAM (KLONOPIN) 1 MG tablet Take 1 mg by mouth every 8 (eight) hours as needed for anxiety.    . cloNIDine (CATAPRES) 0.2 MG tablet  Take 0.2 mg by mouth 2 (two) times daily.    . diclofenac (VOLTAREN) 75 MG EC tablet Take 75 mg by mouth daily.    . diphenhydrAMINE (BENADRYL) 25 mg capsule Take 25 mg by mouth every 8 (eight) hours as needed.    . methocarbamol (ROBAXIN) 500 MG tablet Take 1 tablet by mouth 2 (two) times daily as needed.  0  . Misc Natural Products (OSTEO BI-FLEX TRIPLE STRENGTH PO) Take 1 tablet by mouth daily.     . Multiple Vitamins-Minerals (ONE-A-DAY WOMENS 50+ ADVANTAGE PO) Take by mouth daily.    Marland Kitchen olmesartan (BENICAR) 40 MG tablet Take 40 mg by mouth daily.    . pantoprazole (PROTONIX) 40 MG tablet  Take 40 mg by mouth daily.    . promethazine (PHENERGAN) 50 MG tablet Take 50 mg by mouth every 6 (six) hours as needed for nausea or vomiting.    . sucralfate (CARAFATE) 1 g tablet Take 1 g by mouth 2 (two) times daily.    . traMADol (ULTRAM) 50 MG tablet Take 50 mg by mouth every 6 (six) hours as needed.    . venlafaxine (EFFEXOR) 75 MG tablet Take 75 mg by mouth 2 (two) times daily.     No current facility-administered medications for this visit.    Allergies  Allergen Reactions  . Lactose Intolerance (Gi)   . Pregabalin Nausea And Vomiting  . Shellfish Allergy Itching and Other (See Comments)    Severe hives     Review of Systems: All systems reviewed and negative except where noted in HPI.    Physical Exam: BP 140/80   Pulse 84   Ht  (1.6 m)   Wt 226 lb (102.5 kg)   BMI 40.03 kg/m  Constitutional:  pleaseant obese black female in no acute distress. Psychiatric: Normal mood and affect. Behavior is normal. EENT: Pupils normal.  Conjunctivae are normal. No scleral icterus. Neck supple.  Cardiovascular: Normal rate, regular rhythm. No edema Pulmonary/chest: Effort normal and breath sounds normal. No wheezing, rales or rhonchi. Abdominal: Soft, obese, nontender. Bowel sounds active throughout. There are no masses palpable. No hepatomegaly. Lymphadenopathy: No cervical adenopathy noted. Neurological: Alert and oriented to person place and time. Skin: Skin is warm and dry. No rashes noted.   ASSESSMENT AND PLAN:  1. 66 yo female referred by Urgent Care in July for nausea and vomiting which has since resolved.   2. GERD, suboptimally controlled. Frequent postprandial pyrosis. -GERD literature given -Will try temporarily increase Protonix to to BID-AC 30 minutes prior to breakfast and dinner  3. Excessive gas.  -gas brochure given  4. Intermittent solid food dysphagia. Has to make conscious effort when eating to to avoid meat getting stuck. Esophagitis?   Dysmotility? Stricture?  -will first obtain a barium swallow with tablet. Depending on results she may need an EGD.  -Advised patient to eat small bites, chew well with liquids in between bites to avoid food impaction.  5. Colon cancer screening. She does not want a colonoscopy. Cologuard may be an option, especially since negative FMH and no bowel changes or rectal bleeding.  -Will obtain records from PCP to make sure she is not anemic and that Cologuard hasn't already been done.   Willette Cluster, NP  11/09/2016, 11:06 AM  Addendum : Patient has seen Dr Pecola Leisure several times but saw /  had labs by another MD more recently (Office on [facility]) Will try and obtain records. She will need to come back  for follow up   Addendum: Reviewed and agree with initial management. Agree with Cologuard if no barriers or other reason to go straight to colonoscopy (i.e. IDA) Pyrtle, Carie Caddy, MD

## 2016-11-09 NOTE — Patient Instructions (Addendum)
If you are age 64 or older, your body mass index should be between 23-30. Your Body mass index is 40.03 kg/m. If this is out of the aforementioned range listed, please consider follow up with your Primary Care Provider.  If you are age 64 or younger, your body mass index should be between 19-25. Your Body mass index is 40.03 kg/m. If this is out of the aformentioned range listed, please consider follow up with your Primary Care Provider.   We have sent the following medications to your pharmacy for you to pick up at your convenience: Protonix  INCREASE to twice daily 30 minutes before a meal  You have been scheduled for a Barium Esophogram at [facility] (1st floor of the hospital) on 11/16/16 at 1030 am. Please arrive 15 minutes prior to your appointment for registration. Make certain not to have anything to eat or drink 4 hours prior to your test. If you need to reschedule for any reason, please contact radiology at [phone] to do so. __________________________________________________________________ A barium swallow is an examination that concentrates on views of the esophagus. This tends to be a double contrast exam (barium and two liquids which, when combined, create a gas to distend the wall of the oesophagus) or single contrast (non-ionic iodine based). The study is usually tailored to your symptoms so a good history is essential. Attention is paid during the study to the form, structure and configuration of the esophagus, looking for functional disorders (such as aspiration, dysphagia, achalasia, motility and reflux) EXAMINATION You may be asked to change into a gown, depending on the type of swallow being performed. A radiologist and radiographer will perform the procedure. The radiologist will advise you of the type of contrast selected for your procedure and direct you during the exam. You will be asked to stand, sit or lie in several different positions and to hold a small  amount of fluid in your mouth before being asked to swallow while the imaging is performed .In some instances you may be asked to swallow barium coated marshmallows to assess the motility of a solid food bolus. The exam can be recorded as a digital or video fluoroscopy procedure. POST PROCEDURE It will take 1-2 days for the barium to pass through your system. To facilitate this, it is important, unless otherwise directed, to increase your fluids for the next 24-48hrs and to resume your normal diet.  This test typically takes about 30 minutes to perform. ________________________________________________________________________ Bonita Quin have been given a low gas diet and GERD literature.  Thank you for choosing me and [facility].   Willette Cluster, NP

## 2016-11-16 ENCOUNTER — Ambulatory Visit (HOSPITAL_COMMUNITY)
Admission: RE | Admit: 2016-11-16 | Discharge: 2016-11-16 | Disposition: A | Discharge status: Home / Self Care | Payer: 59 | Source: Ambulatory Visit | Attending: Nurse Practitioner | Admitting: Nurse Practitioner

## 2016-11-16 DIAGNOSIS — K224 Dyskinesia of esophagus: Secondary | ICD-10-CM | POA: Diagnosis not present

## 2016-11-16 DIAGNOSIS — R1310 Dysphagia, unspecified: Secondary | ICD-10-CM | POA: Diagnosis not present

## 2016-12-12 ENCOUNTER — Telehealth: Payer: Self-pay

## 2016-12-12 NOTE — Telephone Encounter (Signed)
I left a message for the patient to call me about this.

## 2016-12-12 NOTE — Telephone Encounter (Signed)
-----   Message from Meredith PelPaula M Guenther, NP sent at 11/25/2016  4:46 PM EDT ----- [name] please call patient next week and discuss a Cologuard with her. I got [facility] notes from [facility], please see my addendum to office note. You can wait to call her when I get barium swallow results back. Thanks

## 2016-12-13 NOTE — Telephone Encounter (Signed)
Patient agrees to have a cologard test done. Order entered through the [employer] physician portal.

## 2017-04-19 NOTE — Telephone Encounter (Signed)
Cologuard has been cancelled to to inactivity per [employer].

## 2017-05-06 ENCOUNTER — Other Ambulatory Visit: Payer: Self-pay | Admitting: Nurse Practitioner

## 2017-07-31 ENCOUNTER — Other Ambulatory Visit: Payer: Self-pay | Admitting: Nurse Practitioner

## 2017-10-27 ENCOUNTER — Other Ambulatory Visit: Payer: Self-pay | Admitting: Nurse Practitioner

## 2017-10-30 NOTE — Telephone Encounter (Signed)
Gunnar Fusiaula please advise.  Patient's cologuard wasn't done correctly.

## 2017-11-22 NOTE — Telephone Encounter (Signed)
One refill then must be seen

## 2017-11-23 NOTE — Telephone Encounter (Signed)
Patient needs an appointment

## 2018-01-21 ENCOUNTER — Other Ambulatory Visit: Payer: Self-pay | Admitting: Nurse Practitioner

## 2018-02-21 ENCOUNTER — Other Ambulatory Visit: Payer: Self-pay | Admitting: Nurse Practitioner

## 2018-04-19 IMAGING — RF DG ESOPHAGUS
9 of 13 series · 13 of 19 positions shown · non-contrast
Comparison: None.

CLINICAL DATA: 65-year-old female with history of dysphagia.
Sensation of food getting stuck in the throat, with need to
regurgitate for the past 4 months.

EXAM:
ESOPHOGRAM / BARIUM SWALLOW / BARIUM TABLET STUDY
TECHNIQUE: Combined double contrast and single contrast examination performed
using effervescent crystals, thick barium liquid, and thin barium
liquid. The patient was observed with fluoroscopy swallowing a 13 mm
barium sulphate tablet.
FLUOROSCOPY TIME:  Fluoroscopy Time:  1 minutes and 48 seconds
Radiation Exposure Index (if provided by the fluoroscopic device):
16.7 mGy

[Series 1: fluoro_barium 2fps_bw · 0.18mm/px · 1 of 1 slices shown (1 of 5)]
[im 1/1]
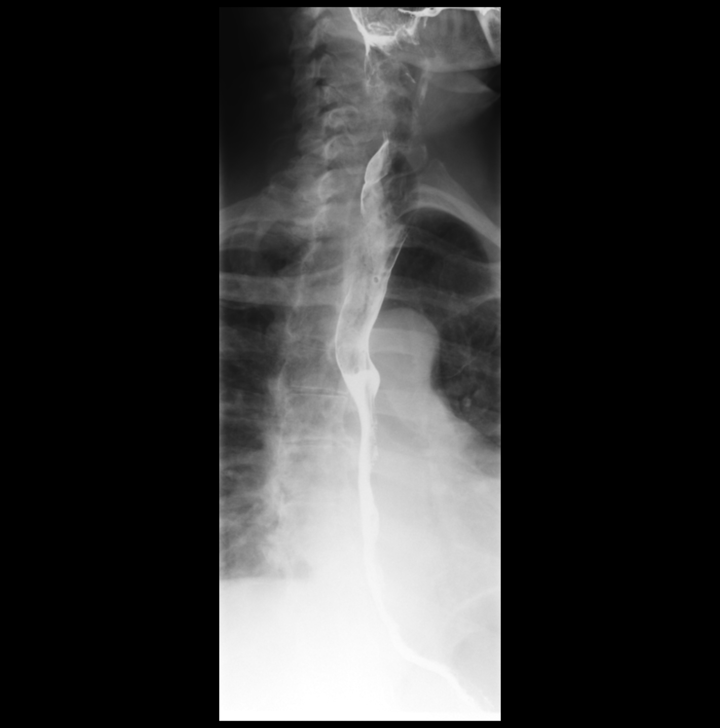

[Series 3: fluoro_barium 2fps_bw · 0.18mm/px · 1 of 1 slices shown (2 of 5)]
[im 1/1]
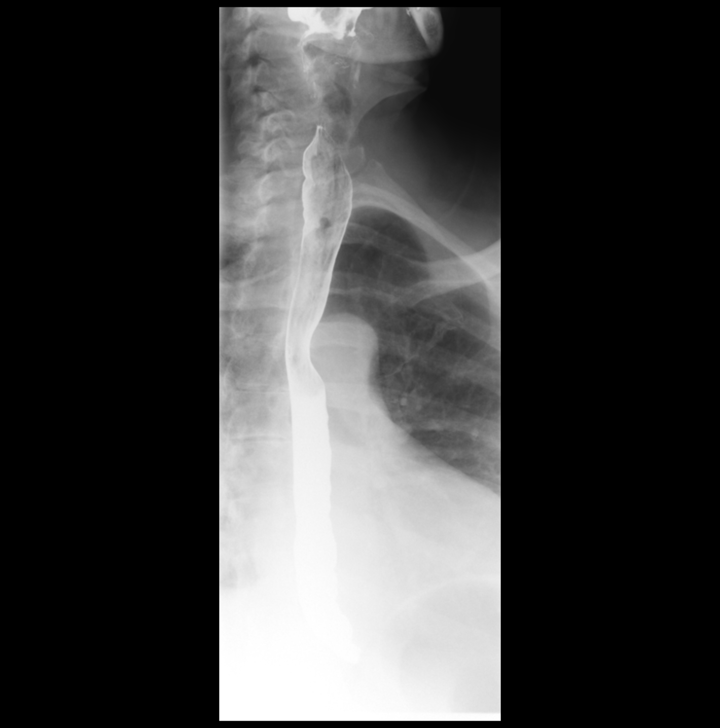

[Series 4: fluoro_barium 2fps_bw · 0.18mm/px · 1 of 1 slices shown (3 of 5)]
[im 1/1]
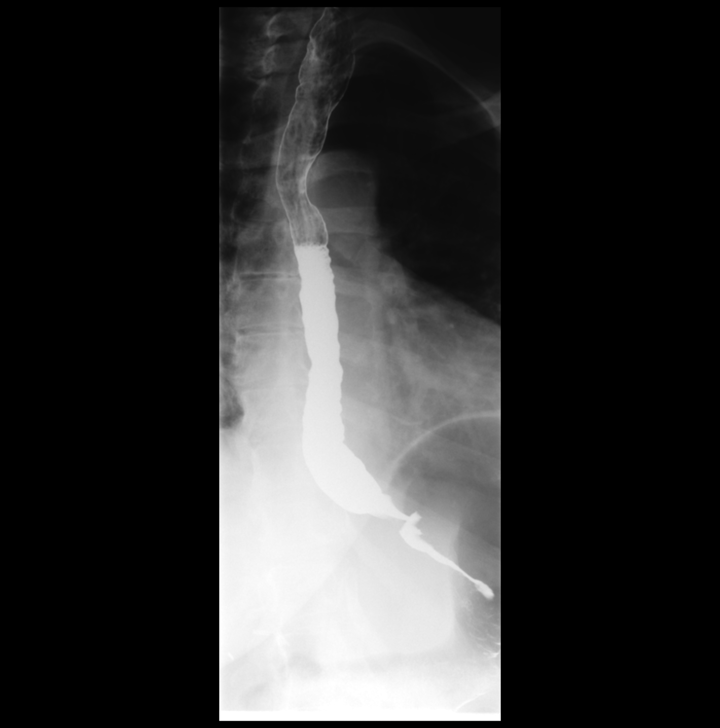

[Series 6: cp_standard · 0.54mm/px · 3 of 32 frames shown (1 of 4)]
[frame 5/32]
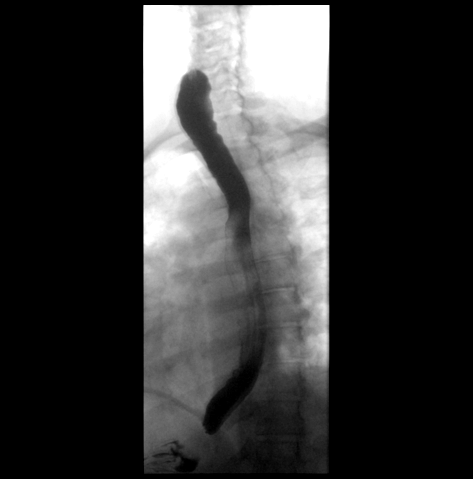
[frame 8/32]
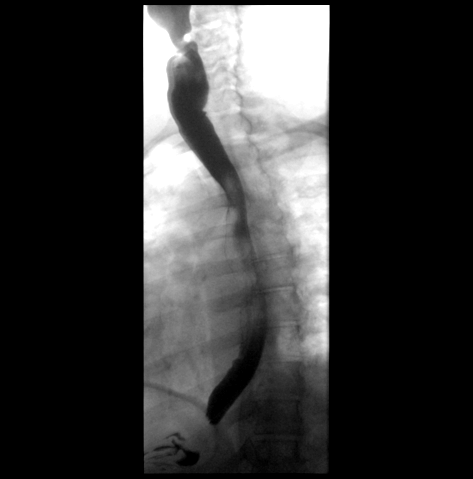
[frame 28/32]
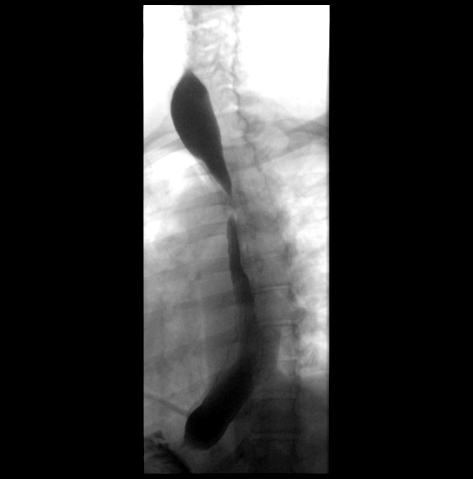

[Series 7: cp_standard · 0.54mm/px · 3 of 36 frames shown (2 of 4)]
[frame 6/36]
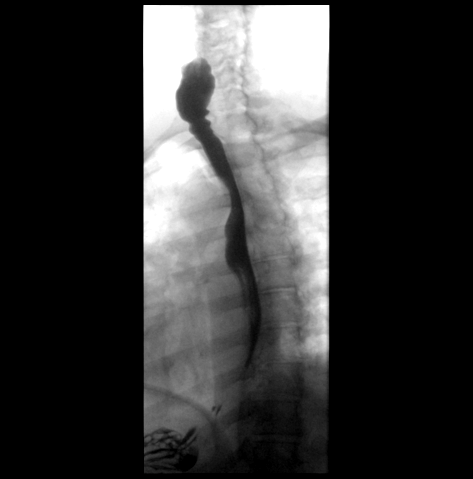
[frame 19/36]
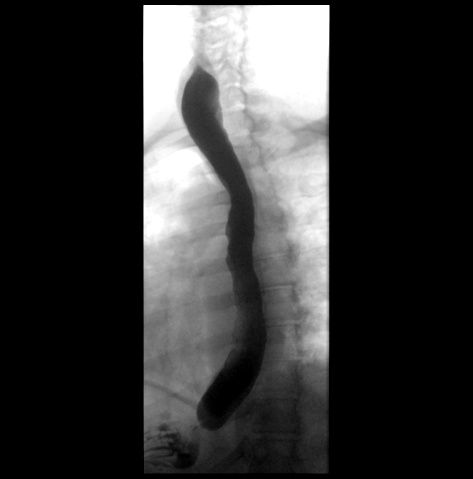
[frame 32/36]
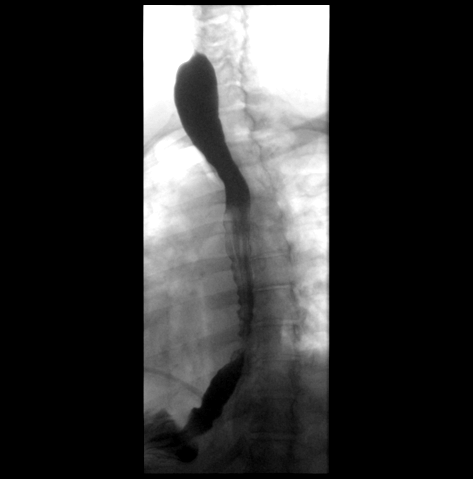

[Series 8: fluoro_barium 2fps_bw · 0.18mm/px · 1 of 1 slices shown (4 of 5)]
[im 1/1]
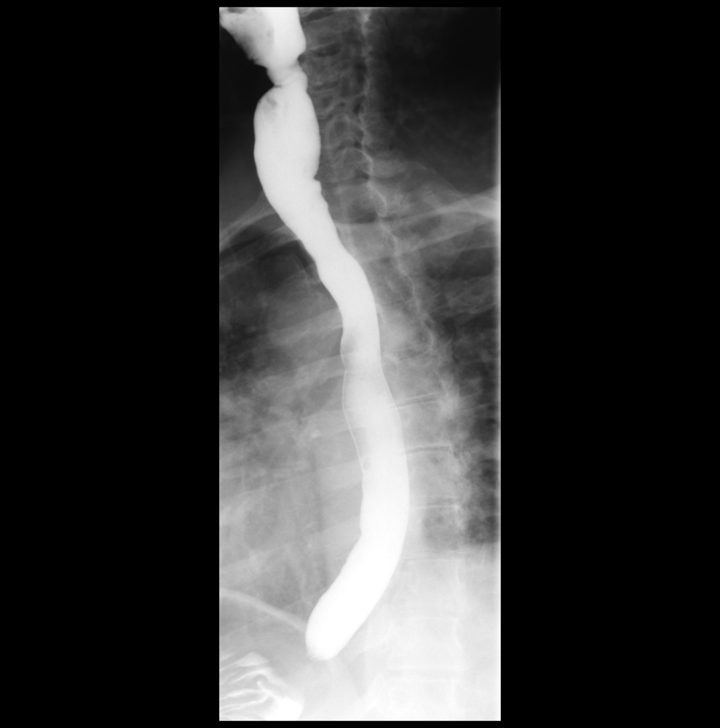

[Series 10: fluoro_barium 2fps_bw · 0.18mm/px · 1 of 1 slices shown (5 of 5)]
[im 1/1]
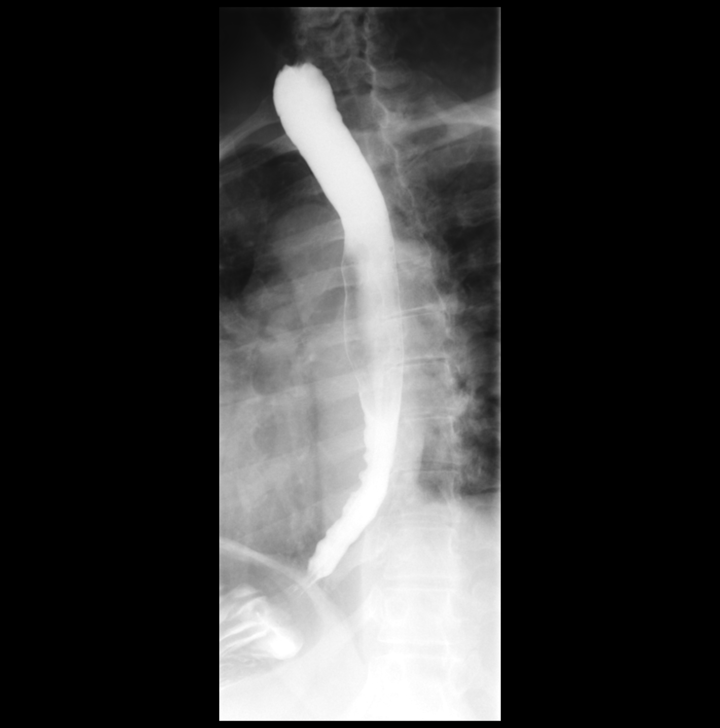

[Series 11: cp_standard · 0.27mm/px · 1 of 1 slices shown (3 of 4)]
[im 1/1]
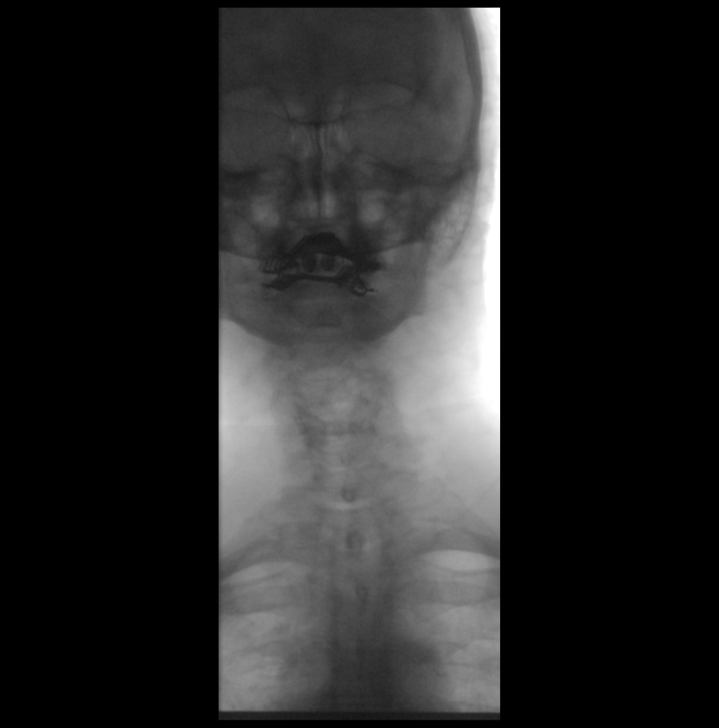

[Series 13: cp_standard · 0.27mm/px · 1 of 1 slices shown (4 of 4)]
[im 1/1]
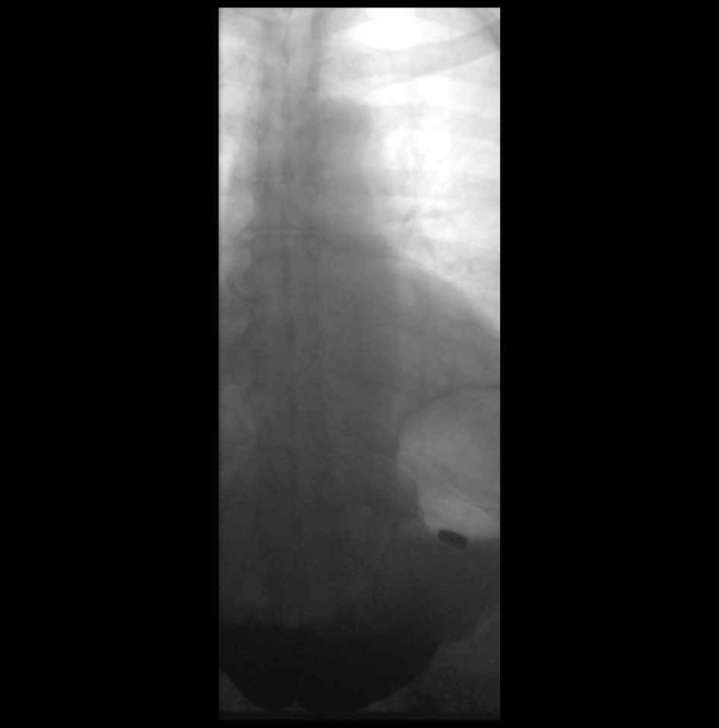

[13 of 19 positions shown; findings below may reference images not displayed]

FINDINGS: Initial double contrast images of the esophagus demonstrate a normal
appearance of the esophageal mucosa. Multiple single swallow
attempts were observed, which demonstrated failure to propagate any
normal primary peristaltic wave. Occasional tertiary contractions
were also noted. Full column esophagram demonstrated no esophageal
mass, stricture or esophageal ring. No hiatal hernia. Water siphon
test demonstrated no gastroesophageal reflux. A barium tablet was
administered, which passed readily into the stomach.
IMPRESSION: 1. Nonspecific esophageal motility disorder with tertiary
contractions.
2. Structurally normal esophagus.

## 2018-08-26 ENCOUNTER — Other Ambulatory Visit: Payer: Self-pay | Admitting: Nurse Practitioner

## 2018-11-16 ENCOUNTER — Other Ambulatory Visit: Payer: Self-pay | Admitting: Nurse Practitioner

## 2018-12-12 ENCOUNTER — Other Ambulatory Visit: Payer: Self-pay | Admitting: Nurse Practitioner

## 2018-12-14 NOTE — Telephone Encounter (Signed)
One month supply, needs appointment. Thanks

## 2019-01-11 ENCOUNTER — Other Ambulatory Visit: Payer: Self-pay | Admitting: Nurse Practitioner
